# Patient Record
Sex: Female | Born: 1970 | Race: White | Hispanic: No | Marital: Married
Health system: Midwestern US, Community
[De-identification: ages and names within clinical notes are randomized; demographics above are authoritative.]

## PROBLEM LIST (undated history)

## (undated) DIAGNOSIS — R519 Headache, unspecified: Secondary | ICD-10-CM

## (undated) DIAGNOSIS — M1990 Unspecified osteoarthritis, unspecified site: Secondary | ICD-10-CM

## (undated) DIAGNOSIS — T7840XA Allergy, unspecified, initial encounter: Secondary | ICD-10-CM

## (undated) DIAGNOSIS — R51 Headache: Secondary | ICD-10-CM

## (undated) DIAGNOSIS — M797 Fibromyalgia: Secondary | ICD-10-CM

## (undated) DIAGNOSIS — G43909 Migraine, unspecified, not intractable, without status migrainosus: Secondary | ICD-10-CM

## (undated) HISTORY — DX: Headache: R51

## (undated) HISTORY — DX: Allergy, unspecified, initial encounter: T78.40XA

## (undated) HISTORY — DX: Unspecified osteoarthritis, unspecified site: M19.90

## (undated) HISTORY — DX: Headache, unspecified: R51.9

## (undated) HISTORY — PX: BREAST SURGERY: SHX581

## (undated) HISTORY — DX: Fibromyalgia: M79.7

## (undated) HISTORY — PX: KNEE SURGERY: SHX244

## (undated) HISTORY — DX: Migraine, unspecified, not intractable, without status migrainosus: G43.909

## (undated) HISTORY — PX: CYSTECTOMY: SUR359

---

## 2004-08-14 ENCOUNTER — Emergency Department (HOSPITAL_COMMUNITY): Admission: EM | Admit: 2004-08-14 | Discharge: 2004-08-14 | Payer: Self-pay | Admitting: Family Medicine

## 2005-06-08 ENCOUNTER — Emergency Department (HOSPITAL_COMMUNITY): Admission: EM | Admit: 2005-06-08 | Discharge: 2005-06-08 | Payer: Self-pay | Admitting: Emergency Medicine

## 2006-03-15 ENCOUNTER — Other Ambulatory Visit: Payer: Self-pay

## 2006-03-15 ENCOUNTER — Emergency Department: Payer: Self-pay | Admitting: Emergency Medicine

## 2006-07-14 ENCOUNTER — Emergency Department: Payer: Self-pay | Admitting: Emergency Medicine

## 2008-11-07 ENCOUNTER — Emergency Department: Payer: Self-pay | Admitting: Emergency Medicine

## 2009-04-10 ENCOUNTER — Emergency Department: Payer: Self-pay | Admitting: Emergency Medicine

## 2013-10-13 ENCOUNTER — Ambulatory Visit: Payer: Self-pay | Admitting: Family Medicine

## 2014-05-16 ENCOUNTER — Ambulatory Visit: Payer: Self-pay | Admitting: Anesthesiology

## 2014-06-13 ENCOUNTER — Ambulatory Visit: Payer: Self-pay | Admitting: Anesthesiology

## 2014-08-21 ENCOUNTER — Ambulatory Visit: Payer: Self-pay | Admitting: Anesthesiology

## 2015-03-03 NOTE — H&P (Signed)
PATIENT NAMMarland Kitchen:  Myer PeerMAHON, Marie L MR#:  [id number] DATE OF BIRTH:  11-05-1971  DATE OF ADMISSION:  05/16/2014  CHIEF COMPLAINT:  Diffuse body pain.   PROCEDURE:  None.   HISTORY OF PRESENT ILLNESS:  Marie Harris is a pleasant 44 year old white female with long-standing history of diffuse body pain lasting 15 years.  She has been told that she has fibromyalgia and has been referred by Dr. Greggory StallionGeorge for evaluation and treatment.  She states that she has been on Ultram for this pain in the past and tried this on a twice a day dosing regimen and this gives her significant improvement with her pain control.  In past followup with Dr. Greggory StallionGeorge she is not comfortable prescribing this medication and has referred the patient to use for evaluation and long-term follow-up management.  At this point Marie Harris states that she has diffuse body pain.  This can be up to a maximum VAS score of 10, average is about a 9 and its best pain is at a 6.  It does not appear to be influenced by time of day.  There are no known inciting events and she is not sure what seems to aggravate or alleviate her pain, but it is associated with fatigue.  The pain wakes her up and does not let her sleep at night.  It is described as constant, exhausting, tiring with associated tingling.  She does not experienced any significant weakness to the upper or lower extremities.  Bowel and bladder function has been stable.   PAST MEDICAL HISTORY:  Significant for fibromyalgia, history of breast surgery, bilateral knee arthroscopy, previous laparoscopy.   CURRENT MEDICATIONS:  Include Aleve.  She takes this up to 5 times a day.   ALLERGIES:  She has no known allergies.   SOCIAL HISTORY:  She does admit to drinking 4 to 5 beers a day.  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  She is married.  She has one child.  She does smoke.  She denies any other drug abuse.   FAMILY HISTORY:  Significant for a father with alcoholism and chronic pain.   Brother with diabetes and sister with sudden unexplained death.  PRIMARY CARE PHYSICIAN:  Dr. Greggory StallionGeorge.   REVIEW OF SYSTEMS:  Significant for smoking, scoliosis, anemia, easy bruising, otherwise negative review of systems.   PHYSICAL EXAMINATION:  GENERAL:  Reveals a pleasant 44 year old white female alert, oriented, cooperative and compliant.  HEART:  Regular rate and rhythm.   EXTREMITIES:  Lower extremity strength and function are at baseline.  LUNGS:  Clear to auscultation.  HEENT:  Reveals pupils to be equally round and reactive to light.  Extraocular muscles intact.  She is able to ambulate without difficulty.  Good gait and balance.   ASSESSMENT: 1.  Fibromyalgia.  2.  Anxiety.  3.  History of excessive alcohol use.   PLAN: 1.  I had a long discussion with Marie Harris and we have gone over the risks and benefits of using Ultram as an analgesic for fibromyalgia.  I think this is quite reasonable especially if it helps her reduce her alcohol consumption or use of excessive nonsteroidal anti-inflammatory drugs and she states that this is what she expects will happen.  2.  I have counseled her regarding excessive alcohol use and told her that she needs to limit her beer consumption to 1 or 2 beers per day, no more than five days per week and to reduce her Aleve consumption  to no more than 1 to 2 tablets per day.  3.  She needs to continue with physical therapy exercises for stretching and strengthening with follow-up in approximately one month and we will give her a prescription today for Ultram 50 mg tablets one by mouth twice daily, #60, with return to clinic in one month.     ____________________________ Currie Paris. Pernell Dupre, MD jga:ea D: 05/17/2014 17:18:01 ET T: 05/17/2014 17:36:42 ET JOB#: [id number]  cc: Currie Paris. Pernell Dupre, MD, <Dictator> Angus Palms, MD Currie Paris Luby Seamans MD ELECTRONICALLY SIGNED 05/26/2014 3:03

## 2015-07-25 ENCOUNTER — Ambulatory Visit: Payer: Self-pay | Admitting: Family Medicine

## 2015-07-25 ENCOUNTER — Encounter: Payer: Self-pay | Admitting: Family Medicine

## 2015-07-25 ENCOUNTER — Ambulatory Visit (INDEPENDENT_AMBULATORY_CARE_PROVIDER_SITE_OTHER): Payer: BLUE CROSS/BLUE SHIELD | Admitting: Family Medicine

## 2015-07-25 VITALS — BP 120/70 | HR 81 | Temp 98.8°F | Resp 16 | Ht 64.0 in | Wt 130.4 lb

## 2015-07-25 DIAGNOSIS — M797 Fibromyalgia: Secondary | ICD-10-CM | POA: Diagnosis not present

## 2015-07-25 DIAGNOSIS — F17200 Nicotine dependence, unspecified, uncomplicated: Secondary | ICD-10-CM | POA: Diagnosis not present

## 2015-07-25 DIAGNOSIS — Z8742 Personal history of other diseases of the female genital tract: Secondary | ICD-10-CM | POA: Diagnosis not present

## 2015-07-25 DIAGNOSIS — N63 Unspecified lump in unspecified breast: Secondary | ICD-10-CM

## 2015-07-25 MED ORDER — DULOXETINE HCL 30 MG PO CPEP: 30.0000 mg | ORAL_CAPSULE | Freq: Every day | ORAL | Status: DC

## 2015-07-25 NOTE — Patient Instructions (Signed)
Plan CP later this fall with Pap and Mammogram.

## 2015-07-25 NOTE — Progress Notes (Signed)
Name: Marie Harris   MRN: [id number]    DOB: 1971/07/03   Date:07/25/2015       Progress Note  Subjective  Chief Complaint  Chief Complaint  Patient presents with  . Establish Care    HPI  Here to establish care.  Has hx. Of fibromyalgia.  Trouble sleeping (stayhing asleep).  Smoker.  !/2 ppd.  No desire to stop at present.    Has gone to Pain Management in past and has taken Tramadol.  Takes it prn only.  BP at home usually 120/80 No problem-specific assessment & plan notes found for this encounter.   Past Medical History  Diagnosis Date  . Allergy   . Arthritis   . Headache   . Migraine   . Fibromyalgia     Past Surgical History  Procedure Laterality Date  . Breast surgery      cyst removal  . Knee surgery      orthoscopic  . Cystectomy      belly and pelvic laproscopic    Family History  Problem Relation Age of Onset  . Hernia Mother   . Stroke Father   . Hyperlipidemia Brother   . Hypertension Brother   . Diabetes Brother   . Brain cancer Son   . Cancer Maternal Aunt     colon cancer  . Diabetes Maternal Uncle   . Heart disease Maternal Grandfather     Social History   Social History  . Marital Status: Married    Spouse Name: N/A  . Number of Children: N/A  . Years of Education: N/A   Occupational History  . Not on file.   Social History Main Topics  . Smoking status: Current Every Day Smoker -- 1.00 packs/day    Types: Cigarettes  . Smokeless tobacco: Not on file  . Alcohol Use: Yes  . Drug Use: No  . Sexual Activity: Not on file   Other Topics Concern  . Not on file   Social History Narrative  . No narrative on file     Current outpatient prescriptions:  .  traMADol (ULTRAM) 50 MG tablet, Take by mouth., Disp: , Rfl:   No Known Allergies   Review of Systems  Constitutional: Positive for malaise/fatigue. Negative for fever, chills and weight loss.  HENT: Negative for hearing loss.   Eyes: Negative for blurred vision and double  vision.  Respiratory: Negative for cough, sputum production, shortness of breath and wheezing.   Cardiovascular: Negative for chest pain, palpitations, orthopnea and leg swelling.  Gastrointestinal: Negative for heartburn, nausea, vomiting, abdominal pain, diarrhea and blood in stool.  Genitourinary: Negative for dysuria, urgency and frequency.  Musculoskeletal: Positive for myalgias.  Skin: Negative for rash.  Neurological: Positive for headaches. Negative for dizziness, tremors, sensory change, focal weakness and weakness.  Psychiatric/Behavioral: Negative for depression. The patient has insomnia (awakening a night.). The patient is not nervous/anxious.       Objective  Filed Vitals:   07/25/15 1357  BP: 146/84  Pulse: 81  Temp: 98.8 F (37.1 C)  TempSrc: Oral  Resp: 16  Height: 5\' 4"  (1.626 m)  Weight: 130 lb 6.4 oz (59.149 kg)    Physical Exam  Constitutional: She is oriented to person, place, and time and well-developed, well-nourished, and in no distress. No distress.  HENT:  Head: Normocephalic and atraumatic.  Eyes: Conjunctivae and EOM are normal. Pupils are equal, round, and reactive to light. No scleral icterus.  Neck: Normal range of motion.  Neck supple. Carotid bruit is not present. No thyromegaly present.  Cardiovascular: Normal rate, regular rhythm, normal heart sounds and intact distal pulses.  Exam reveals no gallop and no friction rub.   No murmur heard. Pulmonary/Chest: Effort normal and breath sounds normal. No respiratory distress. She has no wheezes. She has no rales.  Abdominal: Soft. Bowel sounds are normal. She exhibits no distension and no mass. There is no tenderness.  Musculoskeletal: She exhibits tenderness (diffuse trigger ;point tenderness.). She exhibits no edema.  Lymphadenopathy:    She has no cervical adenopathy.  Neurological: She is alert and oriented to person, place, and time. No cranial nerve deficit.  Psychiatric: Memory, affect and  judgment normal.       No results found for this or any previous visit (from the past 2160 hour(s)).   Assessment & Plan  Problem List Items Addressed This Visit      Musculoskeletal and Integument   Fibromyalgia - Primary     Other   Compulsive tobacco user syndrome      Meds ordered this encounter  Medications  . traMADol (ULTRAM) 50 MG tablet    Sig: Take by mouth.    1. Fibromyalgia  - DULoxetine (CYMBALTA) 30 MG capsule; Take 1 capsule (30 mg total) by mouth daily.  Dispense: 30 capsule; Refill: 3  2. Compulsive tobacco user syndrome   3. Hx of abnormal cervical Pap smear   4. Breast lump

## 2015-08-08 ENCOUNTER — Telehealth: Payer: Self-pay | Admitting: *Deleted

## 2015-08-08 NOTE — Telephone Encounter (Signed)
Called patient to find out is she was evaluated by ER or Urgent care. Patient replied no and that it is getting better.

## 2015-08-08 NOTE — Telephone Encounter (Signed)
Call patient and be sure she was seen for this in ER or at Urgent care and/or that it has resolved.-jh

## 2015-08-08 NOTE — Telephone Encounter (Signed)
Phone advice record: Caller states she believes she was bitten by a spider behind her knee. She tried draining it and it came out clear, very thick liquid. Later she put peroxide on it and bandaged it. Still very swollen and purple. No fever. New med started, feeling nauseated.   Patient was advised to go to the ER.   Allicon Cox RN 08/01/2015.

## 2015-08-28 ENCOUNTER — Ambulatory Visit (INDEPENDENT_AMBULATORY_CARE_PROVIDER_SITE_OTHER): Payer: BLUE CROSS/BLUE SHIELD | Admitting: Family Medicine

## 2015-08-28 ENCOUNTER — Encounter: Payer: Self-pay | Admitting: Family Medicine

## 2015-08-28 ENCOUNTER — Encounter (INDEPENDENT_AMBULATORY_CARE_PROVIDER_SITE_OTHER): Payer: Self-pay

## 2015-08-28 VITALS — BP 135/84 | HR 86 | Temp 98.4°F | Resp 16 | Ht 64.0 in | Wt 130.0 lb

## 2015-08-28 DIAGNOSIS — M797 Fibromyalgia: Secondary | ICD-10-CM | POA: Diagnosis not present

## 2015-08-28 MED ORDER — DULOXETINE HCL 30 MG PO CPEP: ORAL_CAPSULE | ORAL | Status: DC

## 2015-08-28 NOTE — Progress Notes (Signed)
Name: Marie Harris   MRN: [id number]    DOB: 02/24/1971   Date:08/28/2015       Progress Note  Subjective  Chief Complaint  Chief Complaint  Patient presents with  . Fibromyalgia    follow up    HPI Here for f/u of fibromyalgia.  On Cymbalta 30 mg. For sl. More than 30 days.  No change in sx.  No improvement in mood, sleep, pain.  No problem-specific assessment & plan notes found for this encounter.   Past Medical History  Diagnosis Date  . Allergy   . Arthritis   . Headache   . Migraine   . Fibromyalgia     Social History  Substance Use Topics  . Smoking status: Current Every Day Smoker -- 1.00 packs/day    Types: Cigarettes  . Smokeless tobacco: Not on file  . Alcohol Use: Yes     Current outpatient prescriptions:  .  DULoxetine (CYMBALTA) 30 MG capsule, Take 1 capsule (30 mg total) by mouth daily., Disp: 30 capsule, Rfl: 3  No Known Allergies  Review of Systems  Constitutional: Positive for malaise/fatigue. Negative for fever, chills and weight loss.  HENT: Negative for hearing loss.   Eyes: Negative for blurred vision and double vision.  Respiratory: Negative for cough, sputum production, shortness of breath and wheezing.   Cardiovascular: Negative for chest pain, palpitations and leg swelling.  Gastrointestinal: Negative for heartburn, abdominal pain and blood in stool.  Genitourinary: Negative for dysuria, urgency and frequency.  Musculoskeletal: Positive for myalgias.  Neurological: Positive for headaches. Negative for dizziness, tremors and weakness.      Objective  Filed Vitals:   08/28/15 0805  BP: 135/84  Pulse: 86  Temp: 98.4 F (36.9 C)  TempSrc: Oral  Resp: 16  Height: 5\' 4"  (1.626 m)  Weight: 130 lb (58.968 kg)     Physical Exam  Constitutional: She is oriented to person, place, and time and well-developed, well-nourished, and in no distress. No distress.  HENT:  Head: Normocephalic and atraumatic.  Eyes: Conjunctivae and EOM  are normal. Pupils are equal, round, and reactive to light.  Neck: Normal range of motion. Neck supple. No thyromegaly present.  Cardiovascular: Normal rate, regular rhythm and normal heart sounds.  Exam reveals no gallop and no friction rub.   No murmur heard. Pulmonary/Chest: Effort normal and breath sounds normal. No respiratory distress. She has no wheezes. She has no rales.  Abdominal: Bowel sounds are normal. She exhibits no distension. There is no tenderness. There is no rebound.  Musculoskeletal: She exhibits no edema.  Lymphadenopathy:    She has no cervical adenopathy.  Neurological: She is alert and oriented to person, place, and time.  Multiple trigger points in back, chest, elbows, knees, ankles.  Psychiatric:  Affect is sl. Depressed.  Vitals reviewed.     No results found for this or any previous visit (from the past 2160 hour(s)).   Assessment & Plan   1. Fibromyalgia  - DULoxetine (CYMBALTA) 30 MG capsule; Take two capsules each morning  Dispense: 60 capsule; Refill: 3  (increased from 30 mg / day).

## 2015-08-28 NOTE — Patient Instructions (Signed)
Patient declines flu shot today.

## 2015-08-30 ENCOUNTER — Telehealth: Payer: Self-pay | Admitting: *Deleted

## 2015-08-30 NOTE — Telephone Encounter (Signed)
Called patient to f/u on after-hours call. She called and stated she was very confused and disoriented. Today patient states she is fine. I told her to call if she needed to follow-up.

## 2015-08-31 NOTE — Telephone Encounter (Signed)
Agree-jh 

## 2015-09-11 ENCOUNTER — Encounter: Payer: BLUE CROSS/BLUE SHIELD | Admitting: Family Medicine

## 2015-09-13 ENCOUNTER — Encounter: Payer: Self-pay | Admitting: Emergency Medicine

## 2015-09-13 DIAGNOSIS — Y9289 Other specified places as the place of occurrence of the external cause: Secondary | ICD-10-CM | POA: Diagnosis not present

## 2015-09-13 DIAGNOSIS — S52591A Other fractures of lower end of right radius, initial encounter for closed fracture: Secondary | ICD-10-CM | POA: Insufficient documentation

## 2015-09-13 DIAGNOSIS — Z720 Tobacco use: Secondary | ICD-10-CM | POA: Insufficient documentation

## 2015-09-13 DIAGNOSIS — S6991XA Unspecified injury of right wrist, hand and finger(s), initial encounter: Secondary | ICD-10-CM | POA: Diagnosis present

## 2015-09-13 DIAGNOSIS — Y998 Other external cause status: Secondary | ICD-10-CM | POA: Insufficient documentation

## 2015-09-13 DIAGNOSIS — Z3202 Encounter for pregnancy test, result negative: Secondary | ICD-10-CM | POA: Diagnosis not present

## 2015-09-13 DIAGNOSIS — Y9389 Activity, other specified: Secondary | ICD-10-CM | POA: Diagnosis not present

## 2015-09-13 DIAGNOSIS — W01198A Fall on same level from slipping, tripping and stumbling with subsequent striking against other object, initial encounter: Secondary | ICD-10-CM | POA: Insufficient documentation

## 2015-09-13 DIAGNOSIS — S20419A Abrasion of unspecified back wall of thorax, initial encounter: Secondary | ICD-10-CM | POA: Insufficient documentation

## 2015-09-13 MED ORDER — IBUPROFEN 800 MG PO TABS
800.0000 mg | ORAL_TABLET | Freq: Once | ORAL | Status: AC
  Administered 2015-09-13: 800 mg via ORAL

## 2015-09-13 MED ORDER — IBUPROFEN 800 MG PO TABS
ORAL_TABLET | ORAL | Status: AC
  Filled 2015-09-13: qty 1

## 2015-09-13 NOTE — ED Notes (Signed)
Ice to right wrist

## 2015-09-13 NOTE — ED Notes (Signed)
Fall from standing on top of table.  Hit head, injured right wrist.  C/o pain right wirst, back hurts, head hurts.  - LOC.

## 2015-09-14 ENCOUNTER — Emergency Department: Payer: BLUE CROSS/BLUE SHIELD

## 2015-09-14 ENCOUNTER — Emergency Department
Admission: EM | Admit: 2015-09-14 | Discharge: 2015-09-14 | Disposition: A | Discharge status: Home / Self Care | Payer: BLUE CROSS/BLUE SHIELD | Attending: Emergency Medicine | Admitting: Emergency Medicine

## 2015-09-14 DIAGNOSIS — S62102A Fracture of unspecified carpal bone, left wrist, initial encounter for closed fracture: Secondary | ICD-10-CM

## 2015-09-14 LAB — POCT PREGNANCY, URINE: Preg Test, Ur: NEGATIVE

## 2015-09-14 MED ORDER — ONDANSETRON 4 MG PO TBDP
ORAL_TABLET | ORAL | Status: AC
  Administered 2015-09-14: 4 mg
  Filled 2015-09-14: qty 1

## 2015-09-14 MED ORDER — OXYCODONE-ACETAMINOPHEN 5-325 MG PO TABS
1.0000 | ORAL_TABLET | Freq: Once | ORAL | Status: AC
  Administered 2015-09-14: 1 via ORAL

## 2015-09-14 MED ORDER — OXYCODONE-ACETAMINOPHEN 5-325 MG PO TABS
ORAL_TABLET | ORAL | Status: AC
  Administered 2015-09-14: 1 via ORAL
  Filled 2015-09-14: qty 2

## 2015-09-14 MED ORDER — OXYCODONE-ACETAMINOPHEN 5-325 MG PO TABS: 1.0000 | ORAL_TABLET | ORAL | Status: DC | PRN

## 2015-09-14 NOTE — ED Provider Notes (Signed)
[facility] Emergency Department Provider Note  ____________________________________________  Time seen: 2:00 AM  I have reviewed the triage vital signs and the nursing notes.   HISTORY  Chief Complaint No chief complaint on file.     HPI Marie Harris is a 44 y.o. female presents with history of accidental fall all standing on top of the table today. Patient admits to hitting a portion of her head as well as an injury to her right wrist with resultant pain and swelling as well as mid back pain.    Past Medical History  Diagnosis Date  . Allergy   . Arthritis   . Headache   . Migraine   . Fibromyalgia     Patient Active Problem List   Diagnosis Date Noted  . Breast lump 07/25/2015  . Compulsive tobacco user syndrome 07/25/2015  . Fibromyalgia 07/25/2015  . Hx of abnormal cervical Pap smear 07/25/2015    Past Surgical History  Procedure Laterality Date  . Breast surgery      cyst removal  . Knee surgery      orthoscopic  . Cystectomy      belly and pelvic laproscopic    Current Outpatient Rx  Name  Route  Sig  Dispense  Refill  . DULoxetine (CYMBALTA) 30 MG capsule      Take two capsules each morning   60 capsule   3   . oxyCODONE-acetaminophen (PERCOCET/ROXICET) 5-325 MG tablet   Oral   Take 1 tablet by mouth every 4 (four) hours as needed for severe pain.   20 tablet   0     Allergies No known drug allergies  Family History  Problem Relation Age of Onset  . Hernia Mother   . Stroke Father   . Hyperlipidemia Brother   . Hypertension Brother   . Diabetes Brother   . Brain cancer Son   . Cancer Maternal Aunt     colon cancer  . Diabetes Maternal Uncle   . Heart disease Maternal Grandfather     Social History Social History  Substance Use Topics  . Smoking status: Current Every Day Smoker -- 1.00 packs/day    Types: Cigarettes  . Smokeless tobacco: None  . Alcohol Use: Yes    Review of  Systems  Constitutional: Negative for fever. Eyes: Negative for visual changes. ENT: Negative for sore throat. Cardiovascular: Negative for chest pain. Respiratory: Negative for shortness of breath. Gastrointestinal: Negative for abdominal pain, vomiting and diarrhea. Genitourinary: Negative for dysuria. Musculoskeletal: positive for mid back/right wrist pain Skin: Negative for rash. Neurological: Negative for headaches, focal weakness or numbness.   10-point ROS otherwise negative.  ____________________________________________   PHYSICAL EXAM:  VITAL SIGNS: ED Triage Vitals  Enc Vitals Group     BP 09/13/15 2215 111/71 mmHg     Pulse Rate 09/13/15 2215 69     Resp 09/13/15 2215 18     Temp 09/13/15 2215 97.7 F (36.5 C)     Temp Source 09/13/15 2215 Oral     SpO2 09/13/15 2215 95 %     Weight 09/13/15 2215 131 lb (59.421 kg)     Height 09/13/15 2215 5\' 3"  (1.6 m)     Head Cir --      Peak Flow --      Pain Score 09/13/15 2216 10     Pain Loc --      Pain Edu? --      Excl. in GC? --  Constitutional: Alert and oriented. Well appearing and in no distress. Eyes: Conjunctivae are normal. PERRL. Normal extraocular movements. ENT   Head: Normocephalic and atraumatic.   Nose: No congestion/rhinnorhea.   Mouth/Throat: Mucous membranes are moist.   Neck: No stridor. Hematological/Lymphatic/Immunilogical: No cervical lymphadenopathy. Cardiovascular: Normal rate, regular rhythm. Normal and symmetric distal pulses are present in all extremities. No murmurs, rubs, or gallops. Respiratory: Normal respiratory effort without tachypnea nor retractions. Breath sounds are clear and equal bilaterally. No wheezes/rales/rhonchi. Gastrointestinal: Soft and nontender. No distention. There is no CVA tenderness. Genitourinary: deferred Musculoskeletal: pain with palpation of the radial aspect of the right wrist , swelling noted in this area as well. Neurologic:  Normal  speech and language. No gross focal neurologic deficits are appreciated. Speech is normal.  Skin: abrasion noted over T 10 through 12 area/ right wrist pain and swelling on the radial aspect. Psychiatric: Mood and affect are normal. Speech and behavior are normal. Patient exhibits appropriate insight and judgment.      RADIOLOGY Imaging Results       DG Wrist Complete Right (Final result) Result time: 09/14/15 02:28:39   Final result by Rad Results In Interface (09/14/15 02:28:39)   Narrative:   CLINICAL DATA: Fell from standing  EXAM: RIGHT WRIST - COMPLETE 3+ VIEW  COMPARISON: None.  FINDINGS: There is a comminuted intra-articular distal radius fracture with mild step-off in the articular surface due to impaction at the ulnar aspect of the distal radius. There is widening of the distal radial ulnar joint suggesting possibility of distal radial ulnar joint disruption.  IMPRESSION: Intraarticular mildly impacted fracture of the distal radius. Question integrity of the distal radial ulnar joint.   Electronically Signed By: Ellery Plunk M.D. On: 09/14/2015 02:28          DG Thoracic Spine 2 View (Final result) Result time: 09/14/15 62:70:35   Final result by Rad Results In Interface (09/14/15 00:93:81)   Narrative:   CLINICAL DATA: Fell from standing on top of table. Hit head injury right wrist. Back pain.  EXAM: THORACIC SPINE 2 VIEWS  COMPARISON: None.  FINDINGS: There is no evidence of thoracic spine fracture. Alignment is normal. No other significant bone abnormalities are identified.  IMPRESSION: Negative.   Electronically Signed By: Burman Nieves M.D. On: 09/14/2015 02:28          DG Lumbar Spine Complete (Final result) Result time: 09/14/15 02:29:33   Final result by Rad Results In Interface (09/14/15 02:29:33)   Narrative:   CLINICAL DATA: Fell from standing.  EXAM: LUMBAR SPINE - COMPLETE 4+  VIEW  COMPARISON: None.  FINDINGS: There is no evidence of lumbar spine fracture. Alignment is normal. Intervertebral disc spaces are maintained.  IMPRESSION: Negative.   Electronically Signed By: Ellery Plunk M.D. On: 09/14/2015 02:29          CT Head Wo Contrast (Final result) Result time: 09/14/15 02:06:58   Final result by Rad Results In Interface (09/14/15 02:06:58)   Narrative:   CLINICAL DATA: Larey Seat after standing on table top. Head injury without loss of consciousness. History of migraine headaches.  EXAM: CT HEAD WITHOUT CONTRAST  TECHNIQUE: Contiguous axial images were obtained from the base of the skull through the vertex without intravenous contrast.  COMPARISON: CT head October 13, 2013  FINDINGS: The ventricles and sulci are normal. No intraparenchymal hemorrhage, mass effect nor midline shift. No acute large vascular territory infarcts.  No abnormal extra-axial fluid collections. Basal cisterns are patent.  No skull  fracture. The included ocular globes and orbital contents are non-suspicious. Partially imaged LEFT maxillary sinus soft tissue opacification, LEFT sphenoid ethmoidal mucosal thickening. The mastoid air cells are well aerated. RIGHT occipital exostosis.  IMPRESSION: No acute intracranial process.  Partially imaged LEFT paranasal sinusitis.   Electronically Signed By: Awilda Metro M.D. On: 09/14/2015 02:06             INITIAL IMPRESSION / ASSESSMENT AND PLAN / ED COURSE  Pertinent labs & imaging results that were available during my care of the patient were reviewed by me and considered in my medical decision making (see chart for details).  Splint was applied to the patient right wrist. Patient received Percocet for pain and will be prescribed the same for home. Patient is being referred to Dr. Ernest Pine orthopedic surgeon on-call.  ____________________________________________   FINAL CLINICAL  IMPRESSION(S) / ED DIAGNOSES  Final diagnoses:  Left wrist fracture, closed, initial encounter      Darci Current, MD 09/14/15 0410

## 2015-09-14 NOTE — Discharge Instructions (Signed)
Wrist Fracture °A wrist fracture is a break or crack in one of the bones of your wrist. Your wrist is made up of eight small bones at the palm of your hand (carpal bones) and two long bones that make up your forearm (radius and ulna). °CAUSES °· A direct blow to the wrist. °· Falling on an outstretched hand. °· Trauma, such as a car accident or a fall. °RISK FACTORS °Risk factors for wrist fracture include: °· Participating in contact and high-risk sports, such as skiing, biking, and ice skating. °· Taking steroid medicines. °· Smoking. °· Being female. °· Being Caucasian. °· Drinking more than three alcoholic beverages per day. °· Having low or lowered bone density (osteoporosis or osteopenia). °· Age. Older adults have decreased bone density. °· Women who have had menopause. °· History of previous fractures. °SIGNS AND SYMPTOMS °Symptoms of wrist fractures include tenderness, bruising, and inflammation. Additionally, the wrist may hang in an odd position or appear deformed. °DIAGNOSIS °Diagnosis may include: °· Physical exam. °· X-ray. °TREATMENT °Treatment depends on many factors, including the nature and location of the fracture, your age, and your activity level. Treatment for wrist fracture can be nonsurgical or surgical. °Nonsurgical Treatment °A plaster cast or splint may be applied to your wrist if the bone is in a good position. If the fracture is not in good position, it may be necessary for your health care provider to realign it before applying a splint or cast. Usually, a cast or splint will be worn for several weeks. °Surgical Treatment °Sometimes the position of the bone is so far out of place that surgery is required to apply a device to hold it together as it heals. Depending on the fracture, there are a number of options for holding the bone in place while it heals, such as a cast and metal pins. °HOME CARE INSTRUCTIONS °· Keep your injured wrist elevated and move your fingers as much as  possible. °· Do not put pressure on any part of your cast or splint. It may break. °· Use a plastic bag to protect your cast or splint from water while bathing or showering. Do not lower your cast or splint into water. °· Take medicines only as directed by your health care provider. °· Keep your cast or splint clean and dry. If it becomes wet, damaged, or suddenly feels too tight, contact your health care provider right away. °· Do not use any tobacco products including cigarettes, chewing tobacco, or electronic cigarettes. Tobacco can delay bone healing. If you need help quitting, ask your health care provider. °· Keep all follow-up visits as directed by your health care provider. This is important. °· Ask your health care provider if you should take supplements of calcium and vitamins C and D to promote bone healing. °SEEK MEDICAL CARE IF: °· Your cast or splint is damaged, breaks, or gets wet. °· You have a fever. °· You have chills. °· You have continued severe pain or more swelling than you did before the cast was put on. °SEEK IMMEDIATE MEDICAL CARE IF: °· Your hand or fingernails on the injured arm turn blue or gray, or feel cold or numb. °· You have decreased feeling in the fingers of your injured arm. °MAKE SURE YOU: °· Understand these instructions. °· Will watch your condition. °· Will get help right away if you are not doing well or get worse. °  °This information is not intended to replace advice given to you by your   health care provider. Make sure you discuss any questions you have with your health care provider. °  °Document Released: 08/06/2005 Document Revised: 07/18/2015 Document Reviewed: 11/14/2011 °Elsevier Interactive Patient Education ©2016 [employer] ° °

## 2015-10-09 ENCOUNTER — Ambulatory Visit (INDEPENDENT_AMBULATORY_CARE_PROVIDER_SITE_OTHER): Payer: BLUE CROSS/BLUE SHIELD | Admitting: Family Medicine

## 2015-10-09 ENCOUNTER — Ambulatory Visit
Admission: RE | Admit: 2015-10-09 | Discharge: 2015-10-09 | Disposition: A | Discharge status: Home / Self Care | Payer: BLUE CROSS/BLUE SHIELD | Source: Ambulatory Visit | Attending: Family Medicine | Admitting: Family Medicine

## 2015-10-09 ENCOUNTER — Encounter: Payer: Self-pay | Admitting: Family Medicine

## 2015-10-09 VITALS — BP 123/85 | HR 96 | Temp 98.3°F | Resp 16 | Ht 64.0 in | Wt 126.8 lb

## 2015-10-09 DIAGNOSIS — M549 Dorsalgia, unspecified: Secondary | ICD-10-CM

## 2015-10-09 DIAGNOSIS — M797 Fibromyalgia: Secondary | ICD-10-CM | POA: Diagnosis not present

## 2015-10-09 MED ORDER — CYCLOBENZAPRINE HCL 10 MG PO TABS: ORAL_TABLET | ORAL | Status: DC

## 2015-10-09 MED ORDER — MELOXICAM 15 MG PO TABS: 15.0000 mg | ORAL_TABLET | Freq: Every day | ORAL | Status: DC

## 2015-10-09 NOTE — Progress Notes (Signed)
Name: Marie Harris   MRN: [id number]    DOB: 11/10/1971   Date:10/09/2015       Progress Note  Subjective  Chief Complaint  Chief Complaint  Patient presents with  . Fibromyalgia    not improving quit meds migraine came back    HPI Here for f/u of fibromyalgia.  Stopped Cymbalta about 1 month ago because she felt it caused migraine headaches.  No meds for past month.  Broke R  wrist on Nov. 3.  Seeing Dr. Tawanna Cooler Monday at [facility].  Was taking oxycodone, but stopped it last week.  Fibromyalgia doing ok, but having back pain sec. to fall also.  Pain in mid back.  X-rays in ER were neg. Hit back when falling off a table.   No problem-specific assessment & plan notes found for this encounter.   Past Medical History  Diagnosis Date  . Allergy   . Arthritis   . Headache   . Migraine   . Fibromyalgia     Past Surgical History  Procedure Laterality Date  . Breast surgery      cyst removal  . Knee surgery      orthoscopic  . Cystectomy      belly and pelvic laproscopic    Family History  Problem Relation Age of Onset  . Hernia Mother   . Stroke Father   . Hyperlipidemia Brother   . Hypertension Brother   . Diabetes Brother   . Brain cancer Son   . Cancer Maternal Aunt     colon cancer  . Diabetes Maternal Uncle   . Heart disease Maternal Grandfather     Social History   Social History  . Marital Status: Married    Spouse Name: N/A  . Number of Children: N/A  . Years of Education: N/A   Occupational History  . Not on file.   Social History Main Topics  . Smoking status: Current Every Day Smoker -- 1.00 packs/day    Types: Cigarettes  . Smokeless tobacco: Not on file  . Alcohol Use: Yes  . Drug Use: No  . Sexual Activity: Not on file   Other Topics Concern  . Not on file   Social History Narrative     Current outpatient prescriptions:  .  cyclobenzaprine (FLEXERIL) 10 MG tablet, Take 1/2 - 1 tablet up to three times a day for muscle spasm., Disp:  30 tablet, Rfl: 1 .  meloxicam (MOBIC) 15 MG tablet, Take 1 tablet (15 mg total) by mouth daily., Disp: 30 tablet, Rfl: 2  Not on File   Review of Systems  Constitutional: Negative for fever, chills, weight loss and malaise/fatigue.  HENT: Negative for hearing loss.   Eyes: Negative for blurred vision and double vision.  Respiratory: Negative for cough, shortness of breath and wheezing.   Cardiovascular: Negative for chest pain, palpitations and leg swelling.  Gastrointestinal: Negative for heartburn, abdominal pain and blood in stool.  Genitourinary: Negative for dysuria, urgency and frequency.  Musculoskeletal: Positive for back pain.       General fibromyalgia pain is mild at present  Skin: Negative for rash.  Neurological: Negative for dizziness, tremors, weakness and headaches.      Objective  Filed Vitals:   10/09/15 0808  BP: 123/85  Pulse: 96  Temp: 98.3 F (36.8 C)  TempSrc: Oral  Resp: 16  Height:  (1.626 m)  Weight: 126 lb 12.8 oz (57.516 kg)    Physical Exam  Constitutional:  She is well-developed, well-nourished, and in no distress. No distress.  HENT:  Head: Normocephalic and atraumatic.  Cardiovascular: Normal rate, regular rhythm, normal heart sounds and intact distal pulses.  Exam reveals no gallop and no friction rub.   No murmur heard. Pulmonary/Chest: Effort normal. No respiratory distress. She has no wheezes. She has no rales.  Musculoskeletal:  Pain to palp(ation of mid thoracic vert. To palp and with flexion of back.  No bony abn. Felt.  No neuro sx.  Wearing cast on R wrist.       Recent Results (from the past 2160 hour(s))  Pregnancy, urine POC     Status: None   Collection Time: 09/14/15  1:52 AM  Result Value Ref Range   Preg Test, Ur NEGATIVE NEGATIVE    Comment:        THE SENSITIVITY OF THIS METHODOLOGY IS >24 mIU/mL      Assessment & Plan  Problem List Items Addressed This Visit      Musculoskeletal and Integument    Fibromyalgia   Relevant Medications   meloxicam (MOBIC) 15 MG tablet     Other   Back pain, acute - Primary   Relevant Medications   meloxicam (MOBIC) 15 MG tablet   cyclobenzaprine (FLEXERIL) 10 MG tablet   Other Relevant Orders   DG Thoracic Spine W/Swimmers      Meds ordered this encounter  Medications  . meloxicam (MOBIC) 15 MG tablet    Sig: Take 1 tablet (15 mg total) by mouth daily.    Dispense:  30 tablet    Refill:  2  . cyclobenzaprine (FLEXERIL) 10 MG tablet    Sig: Take 1/2 - 1 tablet up to three times a day for muscle spasm.    Dispense:  30 tablet    Refill:  1   1. Back pain, acute  - meloxicam (MOBIC) 15 MG tablet; Take 1 tablet (15 mg total) by mouth daily.  Dispense: 30 tablet; Refill: 2 - cyclobenzaprine (FLEXERIL) 10 MG tablet; Take 1/2 - 1 tablet up to three times a day for muscle spasm.  Dispense: 30 tablet; Refill: 1 - DG Thoracic Spine W/Swimmers; Future  2. Fibromyalgia

## 2015-10-17 ENCOUNTER — Encounter: Payer: BLUE CROSS/BLUE SHIELD | Admitting: Family Medicine

## 2015-11-13 IMAGING — CT CT HEAD W/O CM
1 series · 16 of 30 positions shown, 20 images · non-contrast
Comparison: CT head October 13, 2013

CLINICAL DATA: Fell after standing on table top. Head injury
without loss of consciousness. History of migraine headaches.

EXAM:
CT HEAD WITHOUT CONTRAST
TECHNIQUE: Contiguous axial images were obtained from the base of the skull
through the vertex without intravenous contrast.

[Series 2: head wo · axial · 0.39mm/px · z∈[-136,-7]mm · 16 of 32 slices shown, 20 images]
[im 2/32  brain]
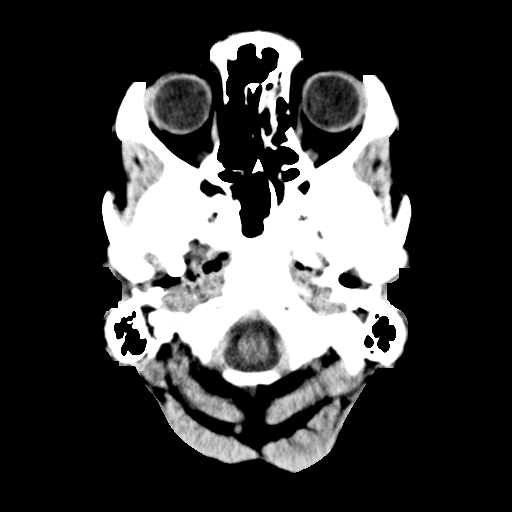
[im 2/32  bone]
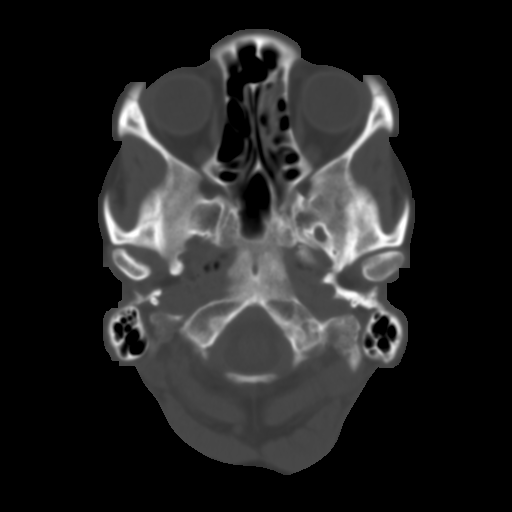
[im 4/32  brain]
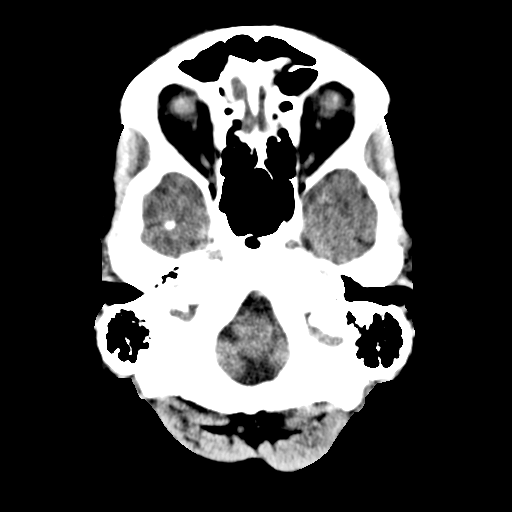
[im 6/32  brain]
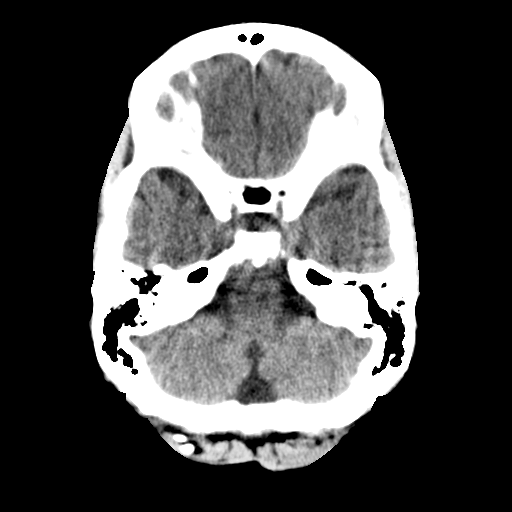
[im 8/32  brain]
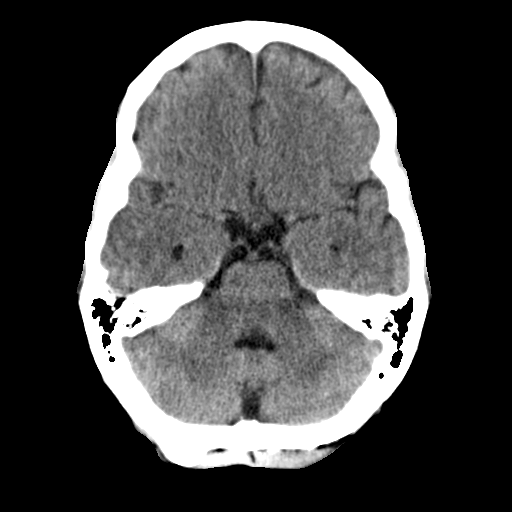
[im 9/32  brain]
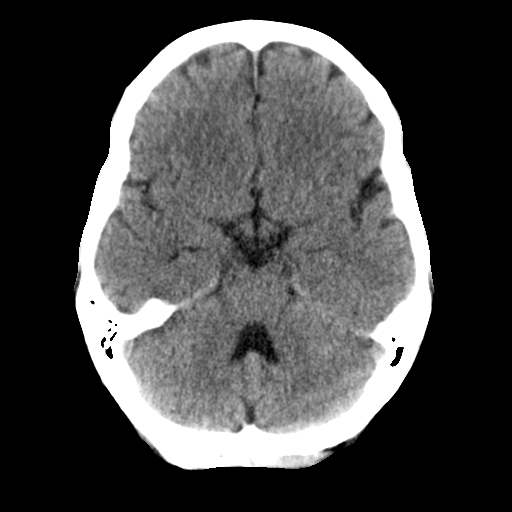
[im 9/32  bone]
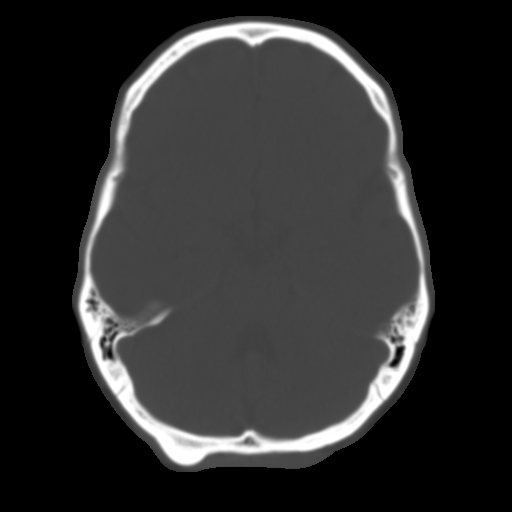
[im 11/32  brain]
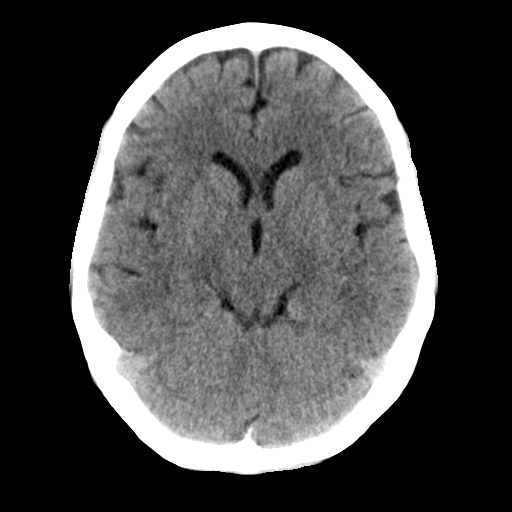
[im 13/32  brain]
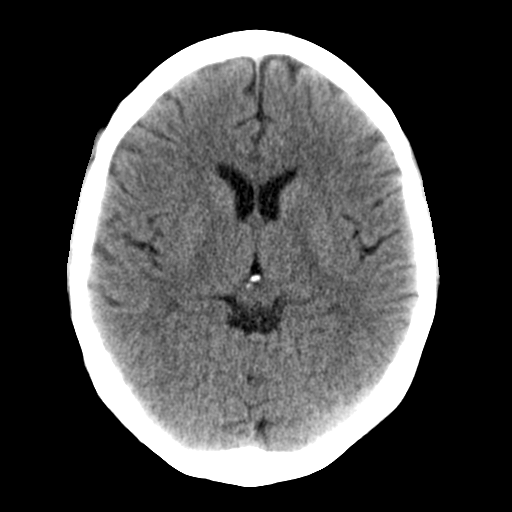
[im 15/32  brain]
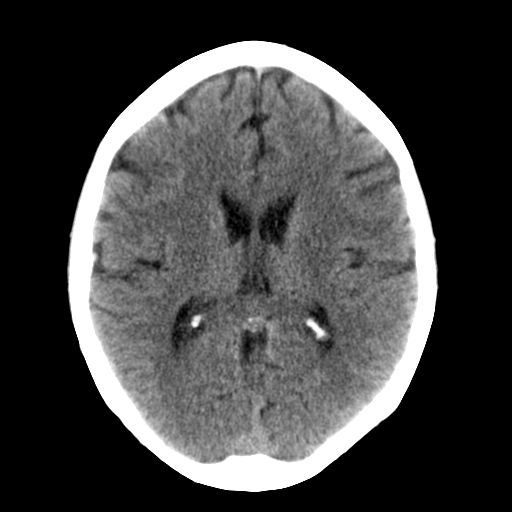
[im 17/32  brain]
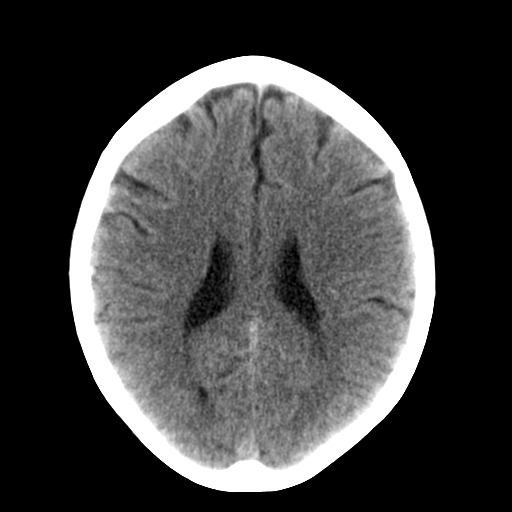
[im 17/32  bone]
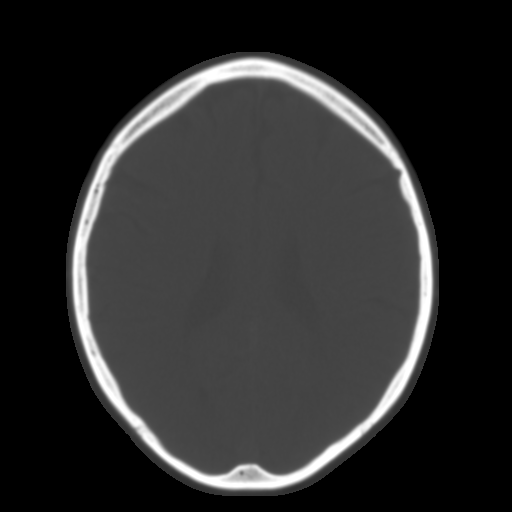
[im 19/32  brain]
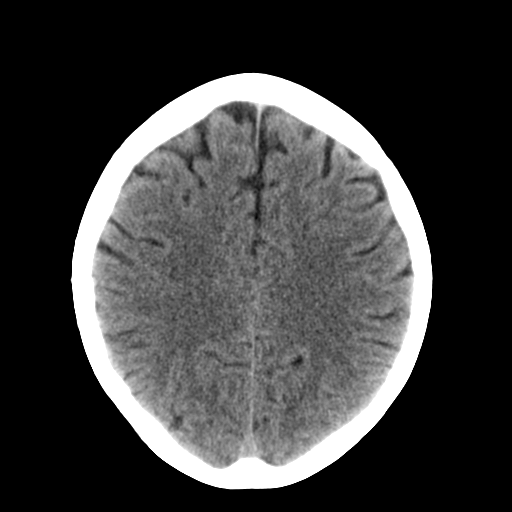
[im 21/32  brain]
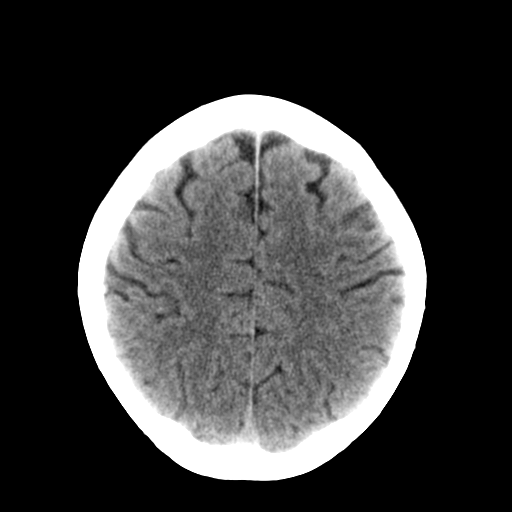
[im 23/32  brain]
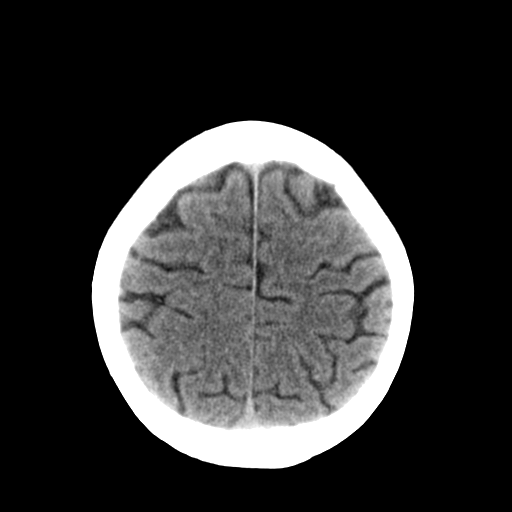
[im 24/32  brain]
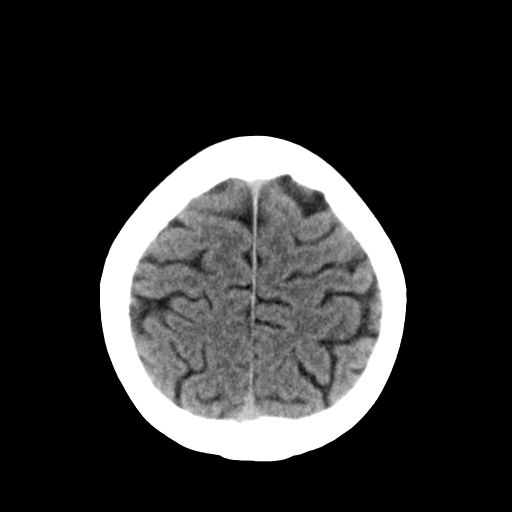
[im 24/32  bone]
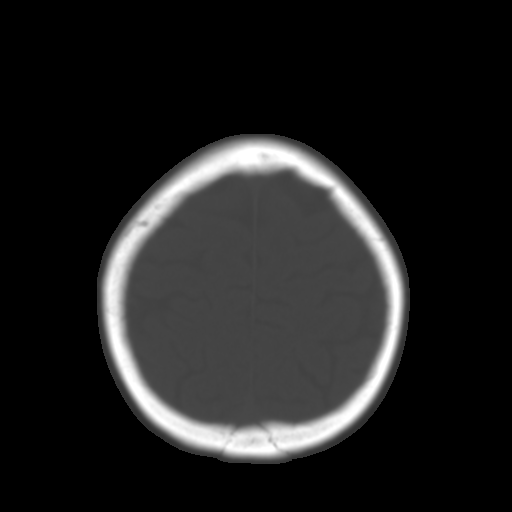
[im 26/32  brain]
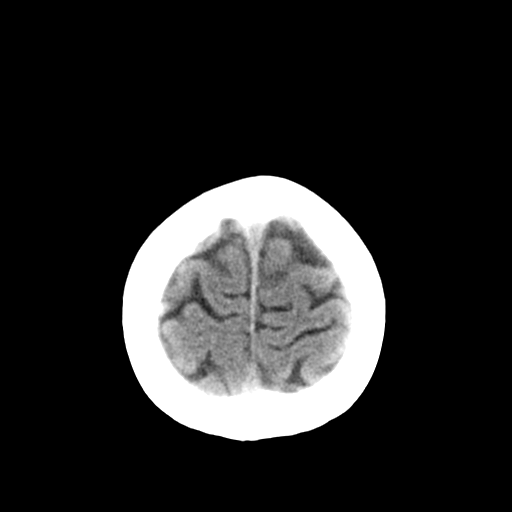
[im 28/32  brain]
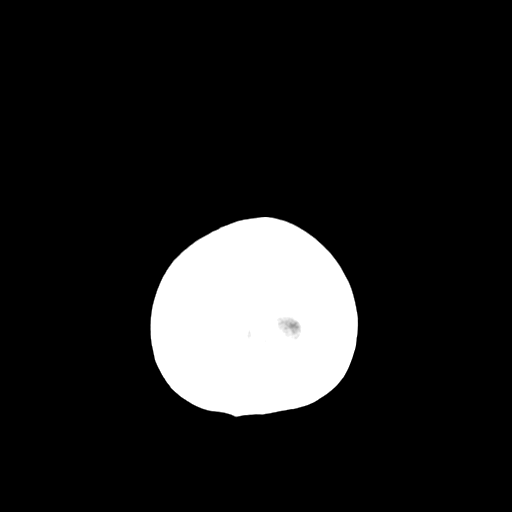
[im 30/32  brain]
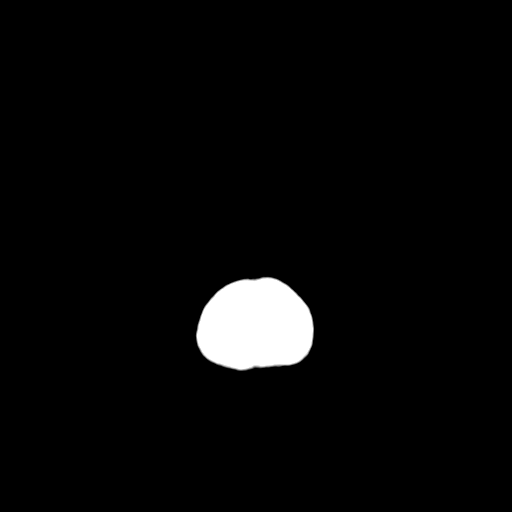

[16 of 30 positions shown; findings below may reference images not displayed]

FINDINGS: The ventricles and sulci are normal. No intraparenchymal hemorrhage,
mass effect nor midline shift. No acute large vascular territory
infarcts.

No abnormal extra-axial fluid collections. Basal cisterns are
patent.

No skull fracture. The included ocular globes and orbital contents
are non-suspicious. Partially imaged LEFT maxillary sinus soft
tissue opacification, LEFT sphenoid ethmoidal mucosal thickening.
The mastoid air cells are well aerated. RIGHT occipital exostosis.
IMPRESSION: No acute intracranial process.

Partially imaged LEFT paranasal sinusitis.

## 2015-12-08 IMAGING — CR DG THORACIC SPINE 3V
1 series · 3 of 3 positions shown · non-contrast
Comparison: Thoracic spine series of September 14, 2015

CLINICAL DATA: Direct trauma to the mid to lower T-spine region on
[DATE][REDACTED], persistent pain

EXAM:
THORACIC SPINE - 3 VIEWS

[Series 1: ap · 0.17mm/px · 3 of 3 slices shown]
[im 1/3]
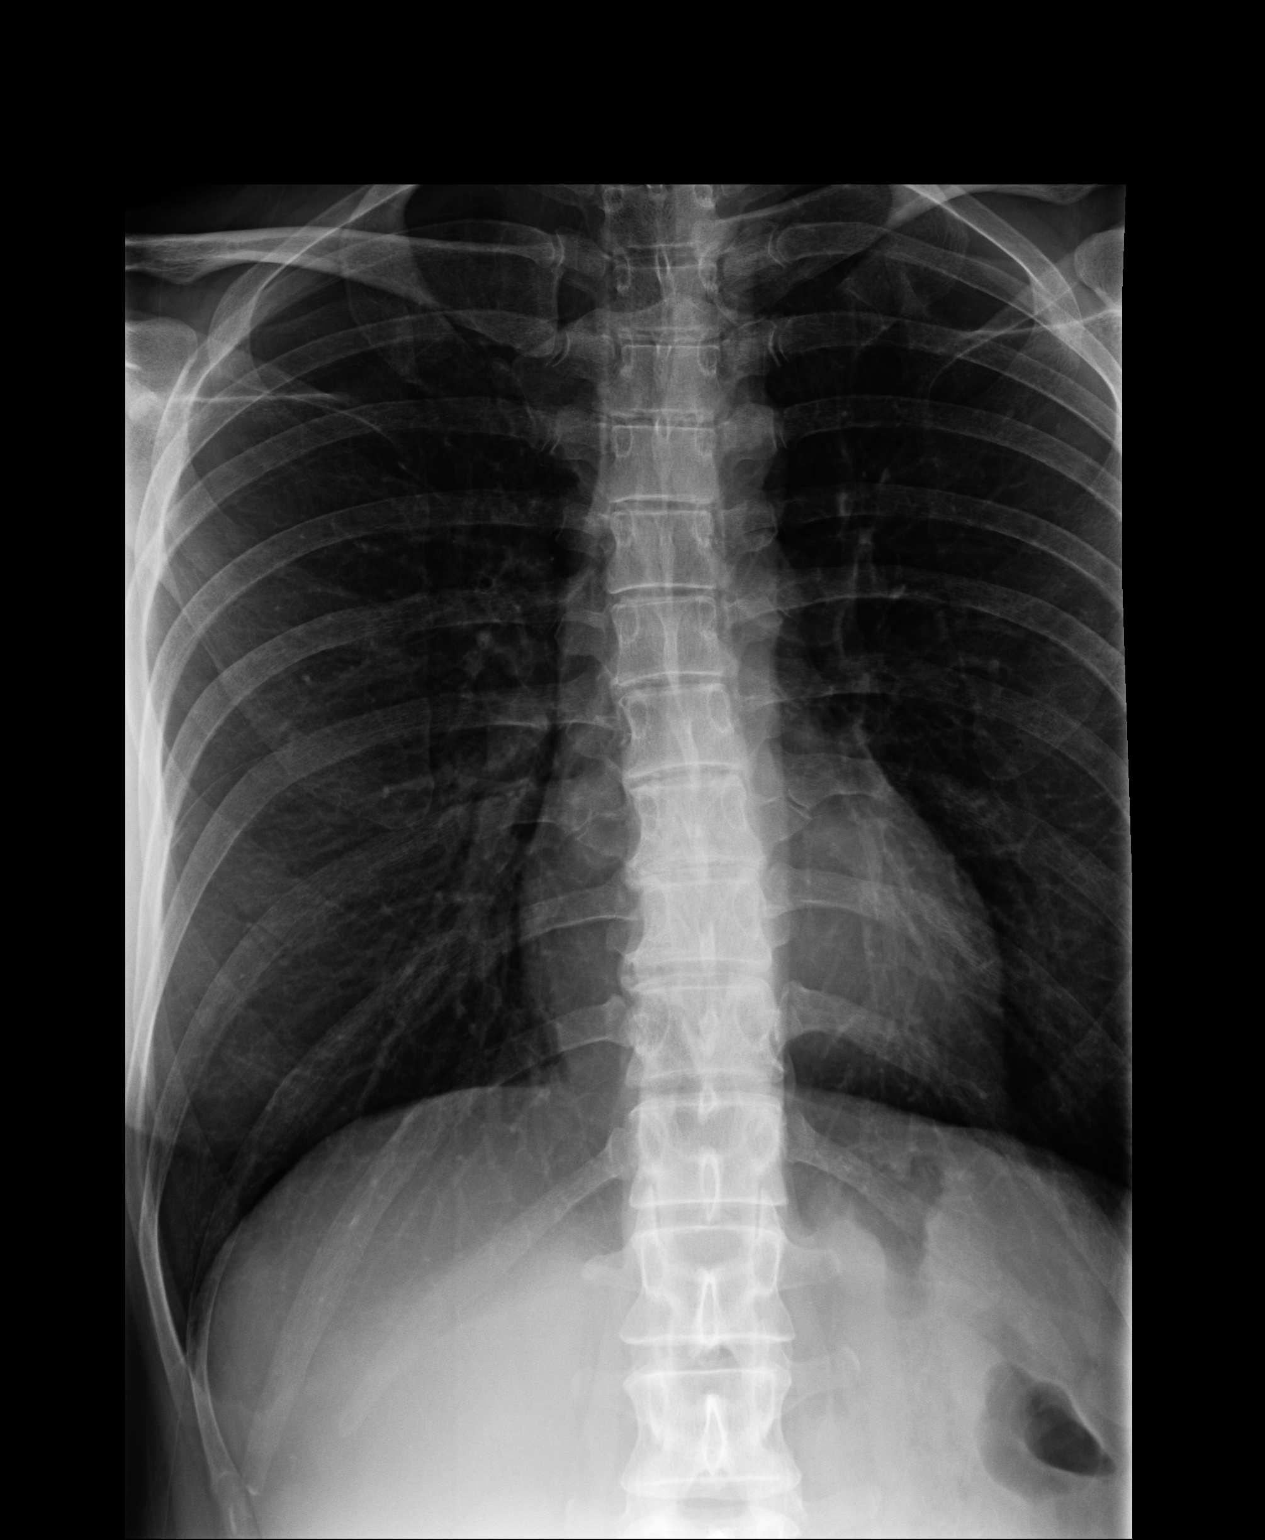
[im 2/3]
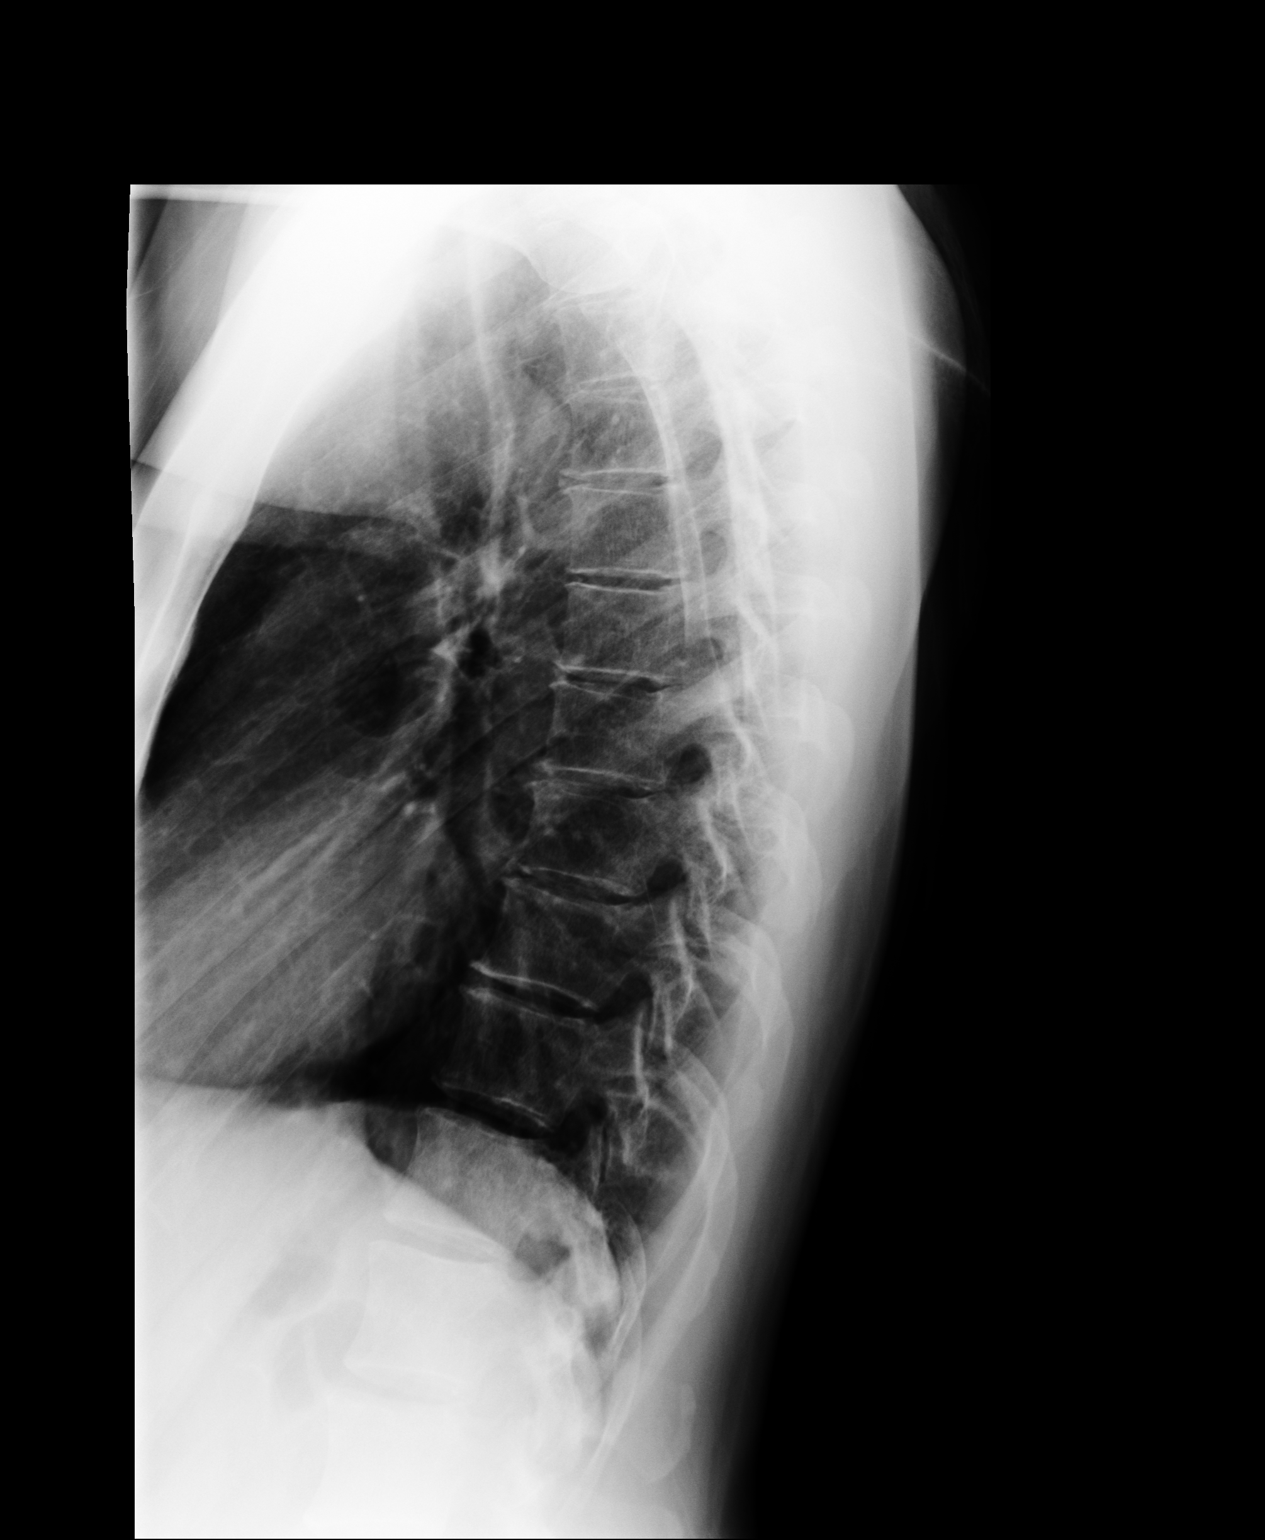
[im 3/3]
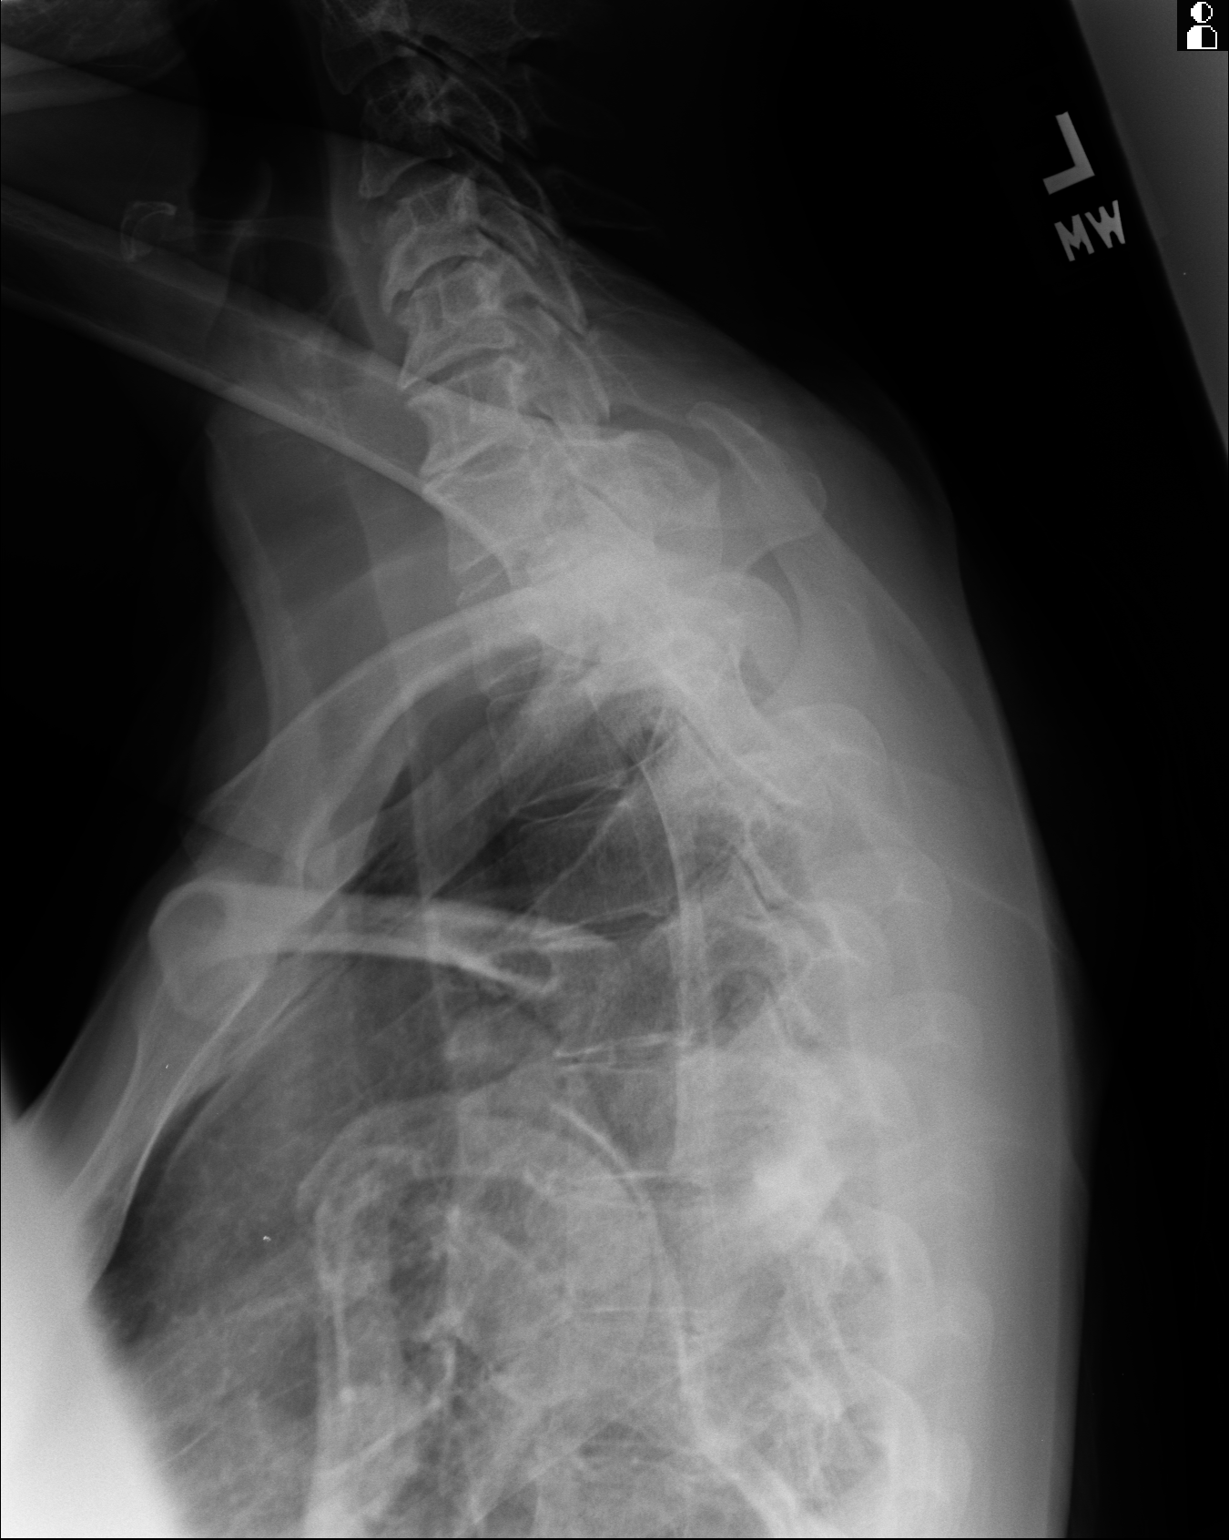

[3 of 3 positions shown; findings below may reference images not displayed]

FINDINGS: The thoracic vertebral bodies are preserved in height. Gentle
dextrocurvature centered at T6 is present and stable. The disc space
heights are well maintained. The pedicles appear intact. There are
no abnormal paravertebral soft tissue densities.
IMPRESSION: There is no acute or significant chronic post traumatic bony
abnormality of the thoracic spine. There is mild stable dextro-
curvature of the mid thoracic spine

## 2015-12-11 ENCOUNTER — Encounter: Payer: Self-pay | Admitting: Family Medicine

## 2015-12-11 ENCOUNTER — Ambulatory Visit (INDEPENDENT_AMBULATORY_CARE_PROVIDER_SITE_OTHER): Payer: BLUE CROSS/BLUE SHIELD | Admitting: Family Medicine

## 2015-12-11 VITALS — BP 130/70 | HR 80 | Temp 97.7°F | Resp 16 | Ht 64.0 in | Wt 130.2 lb

## 2015-12-11 DIAGNOSIS — M797 Fibromyalgia: Secondary | ICD-10-CM

## 2015-12-11 MED ORDER — VENLAFAXINE HCL ER 75 MG PO CP24: 75.0000 mg | ORAL_CAPSULE | Freq: Every day | ORAL | Status: DC

## 2015-12-11 MED ORDER — VENLAFAXINE HCL ER 37.5 MG PO CP24: 37.5000 mg | ORAL_CAPSULE | Freq: Every day | ORAL | Status: DC

## 2015-12-11 NOTE — Progress Notes (Signed)
Name: Marie Harris   MRN: [id number]    DOB: [birth year]   Date:12/11/2015       Progress Note  Subjective  Chief Complaint  Chief Complaint  Patient presents with  . Fibromyalgia    HPI Here for f/u of fibromyalgia.  Also for f/u of fall with wrist and back injuryu.  Wrist and back doing better.  Cannot tell whether some of her pain is still related to fall or to fibromyalgia.  Not on any meds at this time.     She stopped Cymbalta b/o headaches.  She had taken Tramadol on extended basis in past. No problem-specific assessment & plan notes found for this encounter.   Past Medical History  Diagnosis Date  . Allergy   . Arthritis   . Headache   . Migraine   . Fibromyalgia     Past Surgical History  Procedure Laterality Date  . Breast surgery      cyst removal  . Knee surgery      orthoscopic  . Cystectomy      belly and pelvic laproscopic    Family History  Problem Relation Age of Onset  . Hernia Mother   . Stroke Father   . Hyperlipidemia Brother   . Hypertension Brother   . Diabetes Brother   . Brain cancer Son   . Cancer Maternal Aunt     colon cancer  . Diabetes Maternal Uncle   . Heart disease Maternal Grandfather     Social History   Social History  . Marital Status: Married    Spouse Name: N/A  . Number of Children: N/A  . Years of Education: N/A   Occupational History  . Not on file.   Social History Main Topics  . Smoking status: Current Every Day Smoker -- 1.00 packs/day    Types: Cigarettes  . Smokeless tobacco: Not on file  . Alcohol Use: Yes  . Drug Use: No  . Sexual Activity: Not on file   Other Topics Concern  . Not on file   Social History Narrative     Current outpatient prescriptions:  .  meloxicam (MOBIC) 15 MG tablet, Take 1 tablet (15 mg total) by mouth daily. (Patient not taking: Reported on 12/11/2015), Disp: 30 tablet, Rfl: 2 .  venlafaxine XR (EFFEXOR XR) 37.5 MG 24 hr capsule, Take 1 capsule (37.5 mg total) by mouth  daily with breakfast., Disp: 7 capsule, Rfl: 0 .  venlafaxine XR (EFFEXOR XR) 75 MG 24 hr capsule, Take 1 capsule (75 mg total) by mouth daily with breakfast. Start after finishing 37.5 mg caps., Disp: 30 capsule, Rfl: 3  Not on File   Review of Systems  Constitutional: Positive for malaise/fatigue. Negative for fever, chills and weight loss.  HENT: Positive for nosebleeds (random). Negative for hearing loss.   Eyes: Negative for blurred vision and double vision.  Respiratory: Negative for cough, shortness of breath and wheezing.   Cardiovascular: Negative for chest pain, palpitations and leg swelling.  Gastrointestinal: Negative for heartburn, abdominal pain and blood in stool.  Genitourinary: Negative for dysuria, urgency and frequency.  Musculoskeletal: Positive for myalgias and back pain.  Skin: Negative for rash.  Neurological: Positive for weakness. Negative for tremors and headaches.      Objective  Filed Vitals:   12/11/15 0809 12/11/15 0850  BP: 146/89 130/70  Pulse: 80   Temp: 97.7 F (36.5 C)   TempSrc: Oral   Resp: 16   Height:  (1.626  m)   Weight: 130 lb 3.2 oz (59.058 kg)     Physical Exam  Constitutional: She is oriented to person, place, and time and well-developed, well-nourished, and in no distress. No distress.  HENT:  Head: Normocephalic and atraumatic.  Eyes: Conjunctivae and EOM are normal. Pupils are equal, round, and reactive to light. No scleral icterus.  Neck: Normal range of motion. Neck supple. Carotid bruit is not present. No thyromegaly present.  Cardiovascular: Normal rate, regular rhythm and normal heart sounds.  Exam reveals no gallop and no friction rub.   No murmur heard. Pulmonary/Chest: Effort normal and breath sounds normal. No respiratory distress. She has no wheezes. She has no rales.  Musculoskeletal: She exhibits no edema.  Multiple areas of tenderness to palpation over back, elbows, lower arms, legs.  Lymphadenopathy:     She has no cervical adenopathy.  Neurological: She is alert and oriented to person, place, and time. No cranial nerve deficit. Coordination normal.  Vitals reviewed.      Recent Results (from the past 2160 hour(s))  Pregnancy, urine POC     Status: None   Collection Time: 09/14/15  1:52 AM  Result Value Ref Range   Preg Test, Ur NEGATIVE NEGATIVE    Comment:        THE SENSITIVITY OF THIS METHODOLOGY IS >24 mIU/mL      Assessment & Plan  Problem List Items Addressed This Visit      Musculoskeletal and Integument   Fibromyalgia - Primary   Relevant Medications   venlafaxine XR (EFFEXOR XR) 37.5 MG 24 hr capsule   venlafaxine XR (EFFEXOR XR) 75 MG 24 hr capsule      Meds ordered this encounter  Medications  . DISCONTD: oxyCODONE (OXY IR/ROXICODONE) 5 MG immediate release tablet    Sig: Take 5 mg by mouth as needed.  . venlafaxine XR (EFFEXOR XR) 37.5 MG 24 hr capsule    Sig: Take 1 capsule (37.5 mg total) by mouth daily with breakfast.    Dispense:  7 capsule    Refill:  0  . venlafaxine XR (EFFEXOR XR) 75 MG 24 hr capsule    Sig: Take 1 capsule (75 mg total) by mouth daily with breakfast. Start after finishing 37.5 mg caps.    Dispense:  30 capsule    Refill:  3   1. Fibromyalgia  - venlafaxine XR (EFFEXOR XR) 37.5 MG 24 hr capsule; Take 1 capsule (37.5 mg total) by mouth daily with breakfast.  Dispense: 7 capsule; Refill: 0 - venlafaxine XR (EFFEXOR XR) 75 MG 24 hr capsule; Take 1 capsule (75 mg total) by mouth daily with breakfast. Start after finishing 37.5 mg caps.  Dispense: 30 capsule; Refill: 3

## 2016-01-22 ENCOUNTER — Ambulatory Visit: Payer: BLUE CROSS/BLUE SHIELD | Admitting: Family Medicine

## 2016-01-25 ENCOUNTER — Encounter: Payer: Self-pay | Admitting: Family Medicine

## 2016-01-25 ENCOUNTER — Ambulatory Visit (INDEPENDENT_AMBULATORY_CARE_PROVIDER_SITE_OTHER): Payer: BLUE CROSS/BLUE SHIELD | Admitting: Family Medicine

## 2016-01-25 VITALS — BP 142/70 | HR 80 | Temp 98.1°F | Resp 16 | Ht 64.0 in | Wt 129.0 lb

## 2016-01-25 DIAGNOSIS — M797 Fibromyalgia: Secondary | ICD-10-CM | POA: Diagnosis not present

## 2016-01-25 MED ORDER — VENLAFAXINE HCL ER 150 MG PO CP24: 150.0000 mg | ORAL_CAPSULE | Freq: Every day | ORAL | Status: DC

## 2016-01-25 NOTE — Progress Notes (Signed)
Name: Marie Harris   MRN: [id number]    DOB: 09/12/1971   Date:01/25/2016       Progress Note  Subjective  Chief Complaint  Chief Complaint  Patient presents with  . Fibromyalgia    HPI Here to f/u fibromyalgia.  Still with same aches and pains.  Just hurts all over.  No improvement in sleep.  No problem-specific assessment & plan notes found for this encounter.   Past Medical History  Diagnosis Date  . Allergy   . Arthritis   . Headache   . Migraine   . Fibromyalgia     Past Surgical History  Procedure Laterality Date  . Breast surgery      cyst removal  . Knee surgery      orthoscopic  . Cystectomy      belly and pelvic laproscopic    Family History  Problem Relation Age of Onset  . Hernia Mother   . Stroke Father   . Hyperlipidemia Brother   . Hypertension Brother   . Diabetes Brother   . Brain cancer Son   . Cancer Maternal Aunt     colon cancer  . Diabetes Maternal Uncle   . Heart disease Maternal Grandfather     Social History   Social History  . Marital Status: Married    Spouse Name: N/A  . Number of Children: N/A  . Years of Education: N/A   Occupational History  . Not on file.   Social History Main Topics  . Smoking status: Current Every Day Smoker -- 1.00 packs/day    Types: Cigarettes  . Smokeless tobacco: Not on file  . Alcohol Use: Yes  . Drug Use: No  . Sexual Activity: Not on file   Other Topics Concern  . Not on file   Social History Narrative     Current outpatient prescriptions:  .  meloxicam (MOBIC) 15 MG tablet, Take 1 tablet (15 mg total) by mouth daily., Disp: 30 tablet, Rfl: 2 .  venlafaxine XR (EFFEXOR XR) 150 MG 24 hr capsule, Take 1 capsule (150 mg total) by mouth daily with breakfast., Disp: 30 capsule, Rfl: 4  Not on File   Review of Systems  Constitutional: Positive for malaise/fatigue. Negative for fever, chills and weight loss.  HENT: Negative for hearing loss.   Eyes: Negative for blurred vision and  double vision.  Respiratory: Negative for cough, shortness of breath and wheezing.   Cardiovascular: Negative for chest pain, palpitations and leg swelling.  Gastrointestinal: Negative for heartburn, abdominal pain and blood in stool.  Genitourinary: Negative for dysuria, urgency and frequency.  Musculoskeletal: Positive for myalgias.  Skin: Negative for rash.  Neurological: Positive for weakness. Negative for headaches.      Objective  Filed Vitals:   01/25/16 0806  BP: 142/70  Pulse: 80  Temp: 98.1 F (36.7 C)  TempSrc: Oral  Resp: 16  Height: 5\' 4"  (1.626 m)  Weight: 129 lb (58.514 kg)    Physical Exam  Constitutional: She is oriented to person, place, and time and well-developed, well-nourished, and in no distress. No distress.  HENT:  Head: Normocephalic and atraumatic.  Eyes: Conjunctivae and EOM are normal. Pupils are equal, round, and reactive to light. No scleral icterus.  Neck: Normal range of motion. Neck supple. Carotid bruit is not present. No thyromegaly present.  Cardiovascular: Normal rate, regular rhythm and normal heart sounds.  Exam reveals no gallop and no friction rub.   No murmur heard. Pulmonary/Chest: Effort  normal and breath sounds normal. No respiratory distress. She has no wheezes. She has no rales.  Abdominal: Soft. Bowel sounds are normal. She exhibits no distension and no mass. There is no tenderness.  Musculoskeletal: She exhibits no edema.  Lymphadenopathy:    She has no cervical adenopathy.  Neurological: She is alert and oriented to person, place, and time.  Psychiatric:  Affect appears mildly depressed.  Vitals reviewed.      No results found for this or any previous visit (from the past 2160 hour(s)).   Assessment & Plan  Problem List Items Addressed This Visit      Musculoskeletal and Integument   Fibromyalgia - Primary   Relevant Medications   venlafaxine XR (EFFEXOR XR) 150 MG 24 hr capsule      Meds ordered this  encounter  Medications  . venlafaxine XR (EFFEXOR XR) 150 MG 24 hr capsule    Sig: Take 1 capsule (150 mg total) by mouth daily with breakfast.    Dispense:  30 capsule    Refill:  4   1. Fibromyalgia  - venlafaxine XR (EFFEXOR XR) 150 MG 24 hr capsule; Take 1 capsule (150 mg total) by mouth daily with breakfast.  Dispense: 30 capsule; Refill: 4 RTC- 1 month

## 2016-03-10 ENCOUNTER — Ambulatory Visit (INDEPENDENT_AMBULATORY_CARE_PROVIDER_SITE_OTHER): Payer: BLUE CROSS/BLUE SHIELD | Admitting: Family Medicine

## 2016-03-10 ENCOUNTER — Encounter: Payer: Self-pay | Admitting: Family Medicine

## 2016-03-10 VITALS — BP 134/80 | HR 82 | Temp 98.7°F | Resp 16 | Ht 64.0 in | Wt 127.0 lb

## 2016-03-10 DIAGNOSIS — T148XXA Other injury of unspecified body region, initial encounter: Secondary | ICD-10-CM

## 2016-03-10 DIAGNOSIS — M797 Fibromyalgia: Secondary | ICD-10-CM

## 2016-03-10 DIAGNOSIS — T148 Other injury of unspecified body region: Secondary | ICD-10-CM | POA: Diagnosis not present

## 2016-03-10 MED ORDER — VENLAFAXINE HCL ER 75 MG PO CP24: ORAL_CAPSULE | ORAL | Status: DC

## 2016-03-10 NOTE — Progress Notes (Signed)
Name: Marie Harris   MRN: [id number]    DOB: 01/09/1971   Date:03/10/2016       Progress Note  Subjective  Chief Complaint  Chief Complaint  Patient presents with  . Fibromyalgia    HPI Here for f/u of fibromyalgia.  She has had some minor improvement in her pain with higher dose of Effexor.  She c/o bruising with flair ups of fibromyalgia.  No problem-specific assessment & plan notes found for this encounter.   Past Medical History  Diagnosis Date  . Allergy   . Arthritis   . Headache   . Migraine   . Fibromyalgia     Past Surgical History  Procedure Laterality Date  . Breast surgery      cyst removal  . Knee surgery      orthoscopic  . Cystectomy      belly and pelvic laproscopic    Family History  Problem Relation Age of Onset  . Hernia Mother   . Stroke Father   . Hyperlipidemia Brother   . Hypertension Brother   . Diabetes Brother   . Brain cancer Son   . Cancer Maternal Aunt     colon cancer  . Diabetes Maternal Uncle   . Heart disease Maternal Grandfather     Social History   Social History  . Marital Status: Married    Spouse Name: N/A  . Number of Children: N/A  . Years of Education: N/A   Occupational History  . Not on file.   Social History Main Topics  . Smoking status: Current Every Day Smoker -- 1.00 packs/day    Types: Cigarettes  . Smokeless tobacco: Never Used  . Alcohol Use: 0.0 oz/week    0 Standard drinks or equivalent per week  . Drug Use: No  . Sexual Activity: Not on file   Other Topics Concern  . Not on file   Social History Narrative     Current outpatient prescriptions:  .  venlafaxine XR (EFFEXOR-XR) 75 MG 24 hr capsule, Take 3 capsules each morning, Disp: 90 capsule, Rfl: 6  Not on File   Review of Systems  Constitutional: Positive for malaise/fatigue. Negative for fever, chills and weight loss.  HENT: Negative for hearing loss.   Eyes: Negative for blurred vision and double vision.  Respiratory: Negative  for cough, shortness of breath and wheezing.   Cardiovascular: Negative for chest pain, palpitations and leg swelling.  Gastrointestinal: Negative for abdominal pain and blood in stool.  Genitourinary: Negative for dysuria, urgency and frequency.  Musculoskeletal: Positive for myalgias (all over).  Skin: Positive for rash.  Neurological: Negative for tremors, weakness and headaches.      Objective  Filed Vitals:   03/10/16 1539  BP: 134/80  Pulse: 82  Temp: 98.7 F (37.1 C)  TempSrc: Oral  Resp: 16  Height: 5\' 4"  (1.626 m)  Weight: 127 lb (57.607 kg)    Physical Exam  Constitutional: She is oriented to person, place, and time and well-developed, well-nourished, and in no distress. No distress.  HENT:  Head: Normocephalic and atraumatic.  Eyes: Conjunctivae and EOM are normal. Pupils are equal, round, and reactive to light. No scleral icterus.  Neck: Normal range of motion. No thyromegaly present.  Cardiovascular: Normal rate, regular rhythm and normal heart sounds.  Exam reveals no gallop and no friction rub.   No murmur heard. Pulmonary/Chest: Effort normal and breath sounds normal. No respiratory distress. She has no wheezes. She has no rales.  Abdominal: Soft. Bowel sounds are normal. She exhibits no distension and no mass. There is no tenderness.  Musculoskeletal: She exhibits no edema.  Diffuse trigger point tenderness  Lymphadenopathy:    She has no cervical adenopathy.  Neurological: She is alert and oriented to person, place, and time.  Skin:  Only 2 small bruises on L thigh at present.  Vitals reviewed.      No results found for this or any previous visit (from the past 2160 hour(s)).   Assessment & Plan  Problem List Items Addressed This Visit      Musculoskeletal and Integument   Fibromyalgia - Primary   Relevant Medications   venlafaxine XR (EFFEXOR-XR) 75 MG 24 hr capsule     Other   Bruising   Relevant Orders   Comprehensive Metabolic Panel  (CMET)   CBC with Differential      Meds ordered this encounter  Medications  . venlafaxine XR (EFFEXOR-XR) 75 MG 24 hr capsule    Sig: Take 3 capsules each morning    Dispense:  90 capsule    Refill:  6   1. Fibromyalgia  - venlafaxine XR (EFFEXOR-XR) 75 MG 24 hr capsule; Take 3 capsules each morning  Dispense: 90 capsule; Refill: 6  2. Bruising  - Comprehensive Metabolic Panel (CMET) - CBC with Differential

## 2016-03-14 LAB — COMPREHENSIVE METABOLIC PANEL
ALT: 17 IU/L (ref 0–32)
AST: 20 IU/L (ref 0–40)
Albumin/Globulin Ratio: 2 (ref 1.2–2.2)
Albumin: 4.5 g/dL (ref 3.5–5.5)
Alkaline Phosphatase: 46 IU/L (ref 39–117)
BUN/Creatinine Ratio: 11 (ref 9–23)
BUN: 9 mg/dL (ref 6–24)
Bilirubin Total: 0.3 mg/dL (ref 0.0–1.2)
CO2: 24 mmol/L (ref 18–29)
Calcium: 8.9 mg/dL (ref 8.7–10.2)
Chloride: 105 mmol/L (ref 96–106)
Creatinine, Ser: 0.8 mg/dL (ref 0.57–1.00)
GFR calc Af Amer: 103 mL/min/{1.73_m2} (ref >59)
GFR calc non Af Amer: 89 mL/min/{1.73_m2} (ref >59)
Globulin, Total: 2.2 g/dL (ref 1.5–4.5)
Glucose: 100 mg/dL — ABNORMAL HIGH (ref 65–99)
Potassium: 4.7 mmol/L (ref 3.5–5.2)
Sodium: 144 mmol/L (ref 134–144)
Total Protein: 6.7 g/dL (ref 6.0–8.5)

## 2016-03-14 LAB — CBC WITH DIFFERENTIAL/PLATELET
Basophils Absolute: 0 10*3/uL (ref 0.0–0.2)
Basos: 0 %
EOS (ABSOLUTE): 0 10*3/uL (ref 0.0–0.4)
Eos: 0 %
Hematocrit: 42.3 % (ref 34.0–46.6)
Hemoglobin: 14.6 g/dL (ref 11.1–15.9)
Immature Grans (Abs): 0 10*3/uL (ref 0.0–0.1)
Immature Granulocytes: 0 %
Lymphocytes Absolute: 2.2 10*3/uL (ref 0.7–3.1)
Lymphs: 45 %
MCH: 32.5 pg (ref 26.6–33.0)
MCHC: 34.5 g/dL (ref 31.5–35.7)
MCV: 94 fL (ref 79–97)
Monocytes Absolute: 0.6 10*3/uL (ref 0.1–0.9)
Monocytes: 12 %
Neutrophils Absolute: 2.2 10*3/uL (ref 1.4–7.0)
Neutrophils: 43 %
Platelets: 186 10*3/uL (ref 150–379)
RBC: 4.49 x10E6/uL (ref 3.77–5.28)
RDW: 12.5 % (ref 12.3–15.4)
WBC: 5.1 10*3/uL (ref 3.4–10.8)

## 2016-05-19 ENCOUNTER — Ambulatory Visit: Payer: BLUE CROSS/BLUE SHIELD | Admitting: Family Medicine

## 2016-05-27 ENCOUNTER — Ambulatory Visit: Payer: BLUE CROSS/BLUE SHIELD | Admitting: Family Medicine

## 2016-05-28 ENCOUNTER — Encounter: Payer: Self-pay | Admitting: Family Medicine

## 2016-05-28 ENCOUNTER — Ambulatory Visit (INDEPENDENT_AMBULATORY_CARE_PROVIDER_SITE_OTHER): Payer: BLUE CROSS/BLUE SHIELD | Admitting: Family Medicine

## 2016-05-28 VITALS — BP 104/75 | HR 92 | Temp 98.8°F | Resp 16 | Ht 64.0 in | Wt 125.0 lb

## 2016-05-28 DIAGNOSIS — J40 Bronchitis, not specified as acute or chronic: Secondary | ICD-10-CM

## 2016-05-28 DIAGNOSIS — M797 Fibromyalgia: Secondary | ICD-10-CM | POA: Diagnosis not present

## 2016-05-28 MED ORDER — AZITHROMYCIN 250 MG PO TABS: ORAL_TABLET | ORAL | Status: AC

## 2016-05-28 MED ORDER — DM-GUAIFENESIN ER 30-600 MG PO TB12: 1.0000 | ORAL_TABLET | Freq: Two times a day (BID) | ORAL | Status: AC | PRN

## 2016-05-28 MED ORDER — ALBUTEROL SULFATE HFA 108 (90 BASE) MCG/ACT IN AERS: 2.0000 | INHALATION_SPRAY | Freq: Four times a day (QID) | RESPIRATORY_TRACT | Status: AC | PRN

## 2016-05-28 NOTE — Progress Notes (Signed)
Name: Marie Harris   MRN: [id number]    DOB: 05/24/1971   Date:05/28/2016       Progress Note  Subjective  Chief Complaint  Chief Complaint  Patient presents with  . Fibromyalgia    HPI  Here for f/u of fibromyalgia.  She is on 225 mg of Effexor now.  She has more good days now and pain is at least tolerable.  Sleep is +/- .  Sh reports that she has developed a cough with SOB.  Cough productive of milky sputum.  She has developed laryngitis.  This has meds her feel worse than she was before. No problem-specific assessment & plan notes found for this encounter.   Past Medical History  Diagnosis Date  . Allergy   . Arthritis   . Headache   . Migraine   . Fibromyalgia     Past Surgical History  Procedure Laterality Date  . Breast surgery      cyst removal  . Knee surgery      orthoscopic  . Cystectomy      belly and pelvic laproscopic    Family History  Problem Relation Age of Onset  . Hernia Mother   . Stroke Father   . Hyperlipidemia Brother   . Hypertension Brother   . Diabetes Brother   . Brain cancer Son   . Cancer Maternal Aunt     colon cancer  . Diabetes Maternal Uncle   . Heart disease Maternal Grandfather     Social History   Social History  . Marital Status: Married    Spouse Name: N/A  . Number of Children: N/A  . Years of Education: N/A   Occupational History  . Not on file.   Social History Main Topics  . Smoking status: Current Every Day Smoker -- 1.00 packs/day    Types: Cigarettes  . Smokeless tobacco: Never Used  . Alcohol Use: 0.0 oz/week    0 Standard drinks or equivalent per week  . Drug Use: No  . Sexual Activity: Not on file   Other Topics Concern  . Not on file   Social History Narrative     Current outpatient prescriptions:  .  Venlafaxine HCl 225 MG TB24, Take 1 tablet by mouth daily., Disp: , Rfl:  .  albuterol (PROVENTIL HFA;VENTOLIN HFA) 108 (90 Base) MCG/ACT inhaler, Inhale 2 puffs into the lungs every 6 (six)  hours as needed for wheezing or shortness of breath., Disp: 1 Inhaler, Rfl: 6 .  azithromycin (ZITHROMAX) 250 MG tablet, Take 2 tabs on day 1, then 1 tab daily on days 2-5., Disp: 6 tablet, Rfl: 0 .  dextromethorphan-guaiFENesin (MUCINEX DM) 30-600 MG 12hr tablet, Take 1 tablet by mouth 2 (two) times daily as needed for cough., Disp: 20 tablet, Rfl: 0  Not on File   Review of Systems  Constitutional: Positive for fever (?) and malaise/fatigue. Negative for chills and weight loss.  HENT: Negative for hearing loss.   Eyes: Negative for blurred vision and double vision.  Respiratory: Positive for cough and shortness of breath. Negative for wheezing.   Cardiovascular: Negative for chest pain, palpitations and leg swelling.  Gastrointestinal: Negative for heartburn, abdominal pain and blood in stool.  Genitourinary: Negative for dysuria, urgency and frequency.  Musculoskeletal: Positive for myalgias (mildly improved.).  Skin: Negative for rash.  Neurological: Positive for weakness. Negative for dizziness, tremors and headaches.      Objective  Filed Vitals:   05/28/16 1557  BP: 104/75  Pulse: 92  Temp: 98.8 F (37.1 C)  TempSrc: Oral  Resp: 16  Height: 5\' 4"  (1.626 m)  Weight: 125 lb (56.7 kg)    Physical Exam  Constitutional: She is oriented to person, place, and time and well-developed, well-nourished, and in no distress. No distress.  HENT:  Head: Normocephalic and atraumatic.  Right Ear: External ear normal.  Left Ear: External ear normal.  Nose: Nose normal.  Mouth/Throat: Oropharynx is clear and moist.  Neck: Normal range of motion. Neck supple. Carotid bruit is not present. No thyromegaly present.  Cardiovascular: Normal rate, regular rhythm and normal heart sounds.  Exam reveals no gallop and no friction rub.   No murmur heard. Pulmonary/Chest: Effort normal. No respiratory distress. She has no wheezes. She has no rales.  Coarse breath sounds throughout.  No wheezes  or rales.  Musculoskeletal:  Less trigger point tenderness than previously.  Lymphadenopathy:    She has no cervical adenopathy.       Right cervical: No superficial cervical, no deep cervical and no posterior cervical adenopathy present.      Left cervical: No superficial cervical, no deep cervical and no posterior cervical adenopathy present.  Neurological: She is alert and oriented to person, place, and time.  Psychiatric:  Affectg is better than previously.  Less flat affect.    Vitals reviewed.      Recent Results (from the past 2160 hour(s))  Comprehensive Metabolic Panel (CMET)     Status: Abnormal   Collection Time: 03/13/16  9:03 AM  Result Value Ref Range   Glucose 100 (H) 65 - 99 mg/dL   BUN 9 6 - 24 mg/dL   Creatinine, Ser 1.61 0.57 - 1.00 mg/dL   GFR calc non Af Amer 89 >59 mL/min/1.73   GFR calc Af Amer 103 >59 mL/min/1.73   BUN/Creatinine Ratio 11 9 - 23   Sodium 144 134 - 144 mmol/L   Potassium 4.7 3.5 - 5.2 mmol/L   Chloride 105 96 - 106 mmol/L   CO2 24 18 - 29 mmol/L   Calcium 8.9 8.7 - 10.2 mg/dL   Total Protein 6.7 6.0 - 8.5 g/dL   Albumin 4.5 3.5 - 5.5 g/dL   Globulin, Total 2.2 1.5 - 4.5 g/dL   Albumin/Globulin Ratio 2.0 1.2 - 2.2   Bilirubin Total 0.3 0.0 - 1.2 mg/dL   Alkaline Phosphatase 46 39 - 117 IU/L   AST 20 0 - 40 IU/L   ALT 17 0 - 32 IU/L  CBC with Differential     Status: None   Collection Time: 03/13/16  9:03 AM  Result Value Ref Range   WBC 5.1 3.4 - 10.8 x10E3/uL   RBC 4.49 3.77 - 5.28 x10E6/uL   Hemoglobin 14.6 11.1 - 15.9 g/dL   Hematocrit 09.6 04.5 - 46.6 %   MCV 94 79 - 97 fL   MCH 32.5 26.6 - 33.0 pg   MCHC 34.5 31.5 - 35.7 g/dL   RDW 40.9 81.1 - 91.4 %   Platelets 186 150 - 379 x10E3/uL   Neutrophils 43 %   Lymphs 45 %   Monocytes 12 %   Eos 0 %   Basos 0 %   Neutrophils Absolute 2.2 1.4 - 7.0 x10E3/uL   Lymphocytes Absolute 2.2 0.7 - 3.1 x10E3/uL   Monocytes Absolute 0.6 0.1 - 0.9 x10E3/uL   EOS (ABSOLUTE) 0.0 0.0 -  0.4 x10E3/uL   Basophils Absolute 0.0 0.0 - 0.2 x10E3/uL   Immature Granulocytes 0 %  Immature Grans (Abs) 0.0 0.0 - 0.1 x10E3/uL     Assessment & Plan  Problem List Items Addressed This Visit      Musculoskeletal and Integument   Fibromyalgia - Primary    Other Visit Diagnoses    Bronchitis        Relevant Medications    azithromycin (ZITHROMAX) 250 MG tablet    albuterol (PROVENTIL HFA;VENTOLIN HFA) 108 (90 Base) MCG/ACT inhaler    dextromethorphan-guaiFENesin (MUCINEX DM) 30-600 MG 12hr tablet       Meds ordered this encounter  Medications  . Venlafaxine HCl 225 MG TB24    Sig: Take 1 tablet by mouth daily.  Marland Kitchen azithromycin (ZITHROMAX) 250 MG tablet    Sig: Take 2 tabs on day 1, then 1 tab daily on days 2-5.    Dispense:  6 tablet    Refill:  0  . albuterol (PROVENTIL HFA;VENTOLIN HFA) 108 (90 Base) MCG/ACT inhaler    Sig: Inhale 2 puffs into the lungs every 6 (six) hours as needed for wheezing or shortness of breath.    Dispense:  1 Inhaler    Refill:  6  . dextromethorphan-guaiFENesin (MUCINEX DM) 30-600 MG 12hr tablet    Sig: Take 1 tablet by mouth 2 (two) times daily as needed for cough.    Dispense:  20 tablet    Refill:  0   1. Fibromyalgia Cont Effexor, 225 mg/d  2. Bronchitis  - azithromycin (ZITHROMAX) 250 MG tablet; Take 2 tabs on day 1, then 1 tab daily on days 2-5.  Dispense: 6 tablet; Refill: 0 - albuterol (PROVENTIL HFA;VENTOLIN HFA) 108 (90 Base) MCG/ACT inhaler; Inhale 2 puffs into the lungs every 6 (six) hours as needed for wheezing or shortness of breath.  Dispense: 1 Inhaler; Refill: 6 - dextromethorphan-guaiFENesin (MUCINEX DM) 30-600 MG 12hr tablet; Take 1 tablet by mouth 2 (two) times daily as needed for cough.  Dispense: 20 tablet; Refill: 0

## 2022-10-18 ENCOUNTER — Emergency Department: Admit: 2022-10-18 | Payer: PRIVATE HEALTH INSURANCE

## 2022-10-18 ENCOUNTER — Inpatient Hospital Stay
Admit: 2022-10-18 | Discharge: 2022-10-18 | Disposition: A | Discharge status: Home / Self Care | Payer: PRIVATE HEALTH INSURANCE | Attending: Emergency Medicine

## 2022-10-18 DIAGNOSIS — M545 Low back pain, unspecified: Secondary | ICD-10-CM

## 2022-10-18 MED ORDER — KETOROLAC TROMETHAMINE 30 MG/ML IJ SOLN
30 MG/ML | INTRAMUSCULAR | Status: AC
Start: 2022-10-18 — End: 2022-10-18
  Administered 2022-10-18: 19:00:00 30 mg via INTRAMUSCULAR

## 2022-10-18 MED ORDER — CYCLOBENZAPRINE HCL 10 MG PO TABS
10 MG | ORAL_TABLET | Freq: Three times a day (TID) | ORAL | 0 refills | Status: DC | PRN
Start: 2022-10-18 — End: 2022-10-18

## 2022-10-18 MED ORDER — CYCLOBENZAPRINE HCL 10 MG PO TABS
10 MG | ORAL_TABLET | Freq: Three times a day (TID) | ORAL | 0 refills | Status: AC | PRN
Start: 2022-10-18 — End: 2022-10-28

## 2022-10-18 MED ORDER — DICLOFENAC SODIUM 50 MG PO TBEC
50 MG | ORAL_TABLET | Freq: Two times a day (BID) | ORAL | 3 refills | Status: AC
Start: 2022-10-18 — End: ?

## 2022-10-18 MED ORDER — DICLOFENAC SODIUM 50 MG PO TBEC
50 MG | ORAL_TABLET | Freq: Two times a day (BID) | ORAL | 3 refills | Status: DC
Start: 2022-10-18 — End: 2022-10-18

## 2022-10-18 MED FILL — KETOROLAC TROMETHAMINE 30 MG/ML IJ SOLN: 30 MG/ML | INTRAMUSCULAR | Qty: 1

## 2022-10-18 NOTE — ED Provider Notes (Signed)
Emergency Department Provider Note       PCP: No primary care provider on file.   Age: 51 y.o.   Sex: female     DISPOSITION Decision To Discharge 10/18/2022 02:02:43 PM       ICD-10-CM    1. Acute midline low back pain without sciatica  M54.50           Medical Decision Making     Complexity of Problems Addressed:  1 or more acute illnesses that pose a threat to life or bodily function.     Data Reviewed and Analyzed:   I independently ordered and reviewed each unique test.         I independently ordered and interpreted the ED       I interpreted the X-rays no fracture.    Discussion of management or test interpretation.  Pt with lower back pain. No injury. No acute on xray. Feeling some better here. Will discharge with meds at home and outpatient follow up.       Risk of Complications and/or Morbidity of Patient Management:  Prescription drug management performed.  Patient was discharged risks and benefits of hospitalization were considered.  Shared medical decision making was utilized in creating the patients health plan today.         Is this patient to be included in the SEP-1 core measure due to severe sepsis or septic shock? No Exclusion criteria - the patient is NOT to be included for SEP-1 Core Measure due to: Infection is not suspected      History      Pt with lower back pain for past day. No injury recently. No saddle anesthesia or change in bowel or bladder function.     The history is provided by the patient. No language interpreter was used.   Back Pain  Location:  Lumbar spine  Quality:  Aching  Radiates to:  Does not radiate  Pain severity:  Moderate  Duration:  1 day  Timing:  Constant  Progression:  Unchanged  Chronicity:  New  Context: not falling, not recent injury and not twisting    Relieved by:  Nothing  Worsened by:  Movement  Associated symptoms: no abdominal pain, no abdominal swelling, no bladder incontinence, no bowel incontinence, no chest pain, no dysuria, no fever, no headaches, no  leg pain, no numbness, no perianal numbness, no tingling and no weakness         Review of Systems   Constitutional:  Negative for chills and fever.   Respiratory:  Negative for cough and shortness of breath.    Cardiovascular:  Negative for chest pain and palpitations.   Gastrointestinal:  Negative for abdominal pain, bowel incontinence, constipation, diarrhea, nausea and vomiting.   Genitourinary:  Negative for bladder incontinence, dysuria and hematuria.   Musculoskeletal:  Positive for back pain. Negative for neck pain.   Skin:  Negative for color change and rash.   Neurological:  Negative for tingling, weakness, numbness and headaches.   All other systems reviewed and are negative.      Physical Exam     Vitals signs and nursing note reviewed:  Vitals:    10/18/22 1317 10/18/22 1402   BP: (!) 118/53    Pulse: 78    Resp: 16    Temp: 98.3 F (36.8 C)    SpO2: 98%    Weight:  57.2 kg (126 lb)   Height:  1.6 m (5\' 3" )  Physical Exam  Vitals and nursing note reviewed.   Constitutional:       Appearance: Normal appearance.   HENT:      Head: Normocephalic and atraumatic.   Cardiovascular:      Rate and Rhythm: Normal rate and regular rhythm.   Pulmonary:      Effort: Pulmonary effort is normal.      Breath sounds: Normal breath sounds. No wheezing.   Abdominal:      General: Bowel sounds are normal.      Palpations: Abdomen is soft.      Tenderness: There is no abdominal tenderness.   Musculoskeletal:         General: Tenderness (lower midline back TTP. no step off deformity.) present. No swelling. Normal range of motion.      Cervical back: Normal range of motion. No tenderness.   Skin:     General: Skin is warm and dry.   Neurological:      Mental Status: She is alert.          Procedures     Procedures    Orders Placed This Encounter   Procedures    XR LUMBAR SPINE (2-3 VIEWS)        Medications given during this emergency department visit:  Medications   ketorolac (TORADOL) injection 30 mg (30 mg  IntraMUSCular Given 10/18/22 1401)       New Prescriptions    CYCLOBENZAPRINE (FLEXERIL) 10 MG TABLET    Take 1 tablet by mouth 3 times daily as needed for Muscle spasms    DICLOFENAC (VOLTAREN) 50 MG EC TABLET    Take 1 tablet by mouth 2 times daily        No past medical history on file.     No past surgical history on file.     Social History     Socioeconomic History    Marital status: Married        Previous Medications    No medications on file        Results for orders placed or performed during the hospital encounter of 10/18/22   XR LUMBAR SPINE (2-3 VIEWS)    Narrative    XR LUMBAR SPINE (2-3 VIEWS)      INDICATION: Age: 89 years. Gender: Female. :  Reason for exam:->pain    COMPARISON: None    FINDINGS:    There is no fracture, traumatic subluxation or other acute osseous abnormality  of the lumbar spine. There are mild to moderate multilevel degenerative disc  changes and spondylosis of the lumbar spine. Aortic vascular calcifications are  noted.      Impression    No acute posttraumatic bony abnormality of the lumbar spine. Mild to moderate  multilevel degenerative disc changes and spondylosis.            XR LUMBAR SPINE (2-3 VIEWS)   Final Result      No acute posttraumatic bony abnormality of the lumbar spine. Mild to moderate   multilevel degenerative disc changes and spondylosis.                              Voice dictation software was used during the making of this note.  This software is not perfect and grammatical and other typographical errors may be present.  This note has not been completely proofread for errors.     Gwenette Greet, MD  10/18/22 [phone]

## 2022-10-18 NOTE — Discharge Instructions (Signed)
Ice and heat to low back

## 2022-10-18 NOTE — ED Triage Notes (Addendum)
Pt arrives to the ER with c/o back pain x one day. Pain 6/10 pain. Pt states no recent injuries but did have a fall prior to thanksgiving.

## 2022-10-18 NOTE — ED Provider Notes (Signed)
Emergency Department Provider Note       PCP: No primary care provider on file.   Age: 51 y.o.   Sex: female     DISPOSITION Decision To Discharge 10/18/2022 02:02:43 PM       ICD-10-CM    1. Acute midline low back pain without sciatica  M54.50           Medical Decision Making     Complexity of Problems Addressed:  1 or more acute illnesses that pose a threat to life or bodily function.     Data Reviewed and Analyzed:   I independently ordered and reviewed each unique test.         I independently ordered and interpreted the ED       I interpreted the X-rays no fracture.    Discussion of management or test interpretation.  Pt with lower back pain. No injury. No acute on xray. Feeling some better here. Will discharge with meds at home and outpatient follow up.       Risk of Complications and/or Morbidity of Patient Management:  Prescription drug management performed.  Patient was discharged risks and benefits of hospitalization were considered.  Shared medical decision making was utilized in creating the patients health plan today.         Is this patient to be included in the SEP-1 core measure due to severe sepsis or septic shock? No Exclusion criteria - the patient is NOT to be included for SEP-1 Core Measure due to: Infection is not suspected      History      Pt with lower back pain for past day. No injury recently. No saddle anesthesia or change in bowel or bladder function.     The history is provided by the patient. No language interpreter was used.   Back Pain  Location:  Lumbar spine  Quality:  Aching  Radiates to:  Does not radiate  Pain severity:  Moderate  Duration:  1 day  Timing:  Constant  Progression:  Unchanged  Chronicity:  New  Context: not falling, not recent injury and not twisting    Relieved by:  Nothing  Worsened by:  Movement  Associated symptoms: no abdominal pain, no abdominal swelling, no bladder incontinence, no bowel incontinence, no chest pain, no dysuria, no fever, no headaches, no  leg pain, no numbness, no perianal numbness, no tingling and no weakness         Review of Systems   Constitutional:  Negative for chills and fever.   Respiratory:  Negative for cough and shortness of breath.    Cardiovascular:  Negative for chest pain and palpitations.   Gastrointestinal:  Negative for abdominal pain, bowel incontinence, constipation, diarrhea, nausea and vomiting.   Genitourinary:  Negative for bladder incontinence, dysuria and hematuria.   Musculoskeletal:  Positive for back pain. Negative for neck pain.   Skin:  Negative for color change and rash.   Neurological:  Negative for tingling, weakness, numbness and headaches.   All other systems reviewed and are negative.      Physical Exam     Vitals signs and nursing note reviewed:  Vitals:    10/18/22 1317 10/18/22 1402   BP: (!) 118/53    Pulse: 78    Resp: 16    Temp: 98.3 F (36.8 C)    SpO2: 98%    Weight:  57.2 kg (126 lb)   Height:  1.6 m (5\' 3" )  Physical Exam  Vitals and nursing note reviewed.   Constitutional:       Appearance: Normal appearance.   HENT:      Head: Normocephalic and atraumatic.   Cardiovascular:      Rate and Rhythm: Normal rate and regular rhythm.   Pulmonary:      Effort: Pulmonary effort is normal.      Breath sounds: Normal breath sounds. No wheezing.   Abdominal:      General: Bowel sounds are normal.      Palpations: Abdomen is soft.      Tenderness: There is no abdominal tenderness.   Musculoskeletal:         General: Tenderness (lower midline back TTP. no step off deformity.) present. No swelling. Normal range of motion.      Cervical back: Normal range of motion. No tenderness.   Skin:     General: Skin is warm and dry.   Neurological:      Mental Status: She is alert.          Procedures     Procedures    Orders Placed This Encounter   Procedures    XR LUMBAR SPINE (2-3 VIEWS)        Medications given during this emergency department visit:  Medications   ketorolac (TORADOL) injection 30 mg (30 mg  IntraMUSCular Given 10/18/22 1401)       New Prescriptions    CYCLOBENZAPRINE (FLEXERIL) 10 MG TABLET    Take 1 tablet by mouth 3 times daily as needed for Muscle spasms    DICLOFENAC (VOLTAREN) 50 MG EC TABLET    Take 1 tablet by mouth 2 times daily        No past medical history on file.     No past surgical history on file.     Social History     Socioeconomic History    Marital status: Married        Previous Medications    No medications on file        Results for orders placed or performed during the hospital encounter of 10/18/22   XR LUMBAR SPINE (2-3 VIEWS)    Narrative    XR LUMBAR SPINE (2-3 VIEWS)      INDICATION: Age: 23 years. Gender: Female. :  Reason for exam:->pain    COMPARISON: None    FINDINGS:    There is no fracture, traumatic subluxation or other acute osseous abnormality  of the lumbar spine. There are mild to moderate multilevel degenerative disc  changes and spondylosis of the lumbar spine. Aortic vascular calcifications are  noted.      Impression    No acute posttraumatic bony abnormality of the lumbar spine. Mild to moderate  multilevel degenerative disc changes and spondylosis.            XR LUMBAR SPINE (2-3 VIEWS)   Final Result      No acute posttraumatic bony abnormality of the lumbar spine. Mild to moderate   multilevel degenerative disc changes and spondylosis.                              Voice dictation software was used during the making of this note.  This software is not perfect and grammatical and other typographical errors may be present.  This note has not been completely proofread for errors.     Gwenette Greet, MD  10/18/22 [phone]

## 2022-10-18 NOTE — Discharge Instructions (Signed)
[facility] PEDIATRIC EMR DEPT  EMERGENCY DEPARTMENT ENCOUNTER      Pt Name: Kristie Horne  MRN: [id number]  [place] June 07, 2013  Date of evaluation: 10/18/2022  Provider: Princess Perna, APRN - NP    CHIEF COMPLAINT       Chief Complaint   Patient presents with    Ankle Pain         HISTORY OF PRESENT ILLNESS   (Location/Symptom, Timing/Onset, Context/Setting, Quality, Duration, Modifying Factors, Severity)  Note limiting factors.   51 y/o female with no significant PMHx presents via wheelchair with family to the ER for evaluation of left ankle pain. Patient states that she was at a [facility] prior to arrival when she jumped off a hard landing onto a spring loaded flooring and twisted her left ankle. Patient denies pain to any other areas, mother denies any HX of injury to the left ankle. Patient denies any numbness or tingling to the LLE.         The history is provided by the patient and a grandparent.         Review of External Medical Records:     Nursing Notes were reviewed.    REVIEW OF SYSTEMS    (2-9 systems for level 4, 10 or more for level 5)     Review of Systems   Musculoskeletal:  Positive for arthralgias.        Left ankle pain   Skin:  Positive for color change.        Bruising to the left lateral ankle.    Neurological: Negative.        Except as noted above the remainder of the review of systems was reviewed and negative.       PAST MEDICAL HISTORY   History reviewed. No pertinent past medical history.      SURGICAL HISTORY     History reviewed. No pertinent surgical history.      CURRENT MEDICATIONS       Discharge Medication List as of 10/18/2022  7:51 PM        CONTINUE these medications which have NOT CHANGED    Details   Pediatric Multiple Vitamins (CHILDRENS MULTIVITAMIN) CHEW Take by mouthHistorical Med      UNABLE TO [place]     Patient has no known allergies.    FAMILY HISTORY     History reviewed. No pertinent family history.       SOCIAL HISTORY        Social History     Socioeconomic History    Marital status: Single     Spouse name: None    Number of children: None    Years of education: None    Highest education level: None   Tobacco Use    Smoking status: Never     Passive exposure: Never    Smokeless tobacco: Never           PHYSICAL EXAM    (up to 7 for level 4, 8 or more for level 5)     ED Triage Vitals   BP Temp Temp src Pulse Resp SpO2 Height Weight   10/18/22 1830 10/18/22 1830 10/18/22 1830 10/18/22 1830 10/18/22 1830 10/18/22 1830 -- 10/18/22 1840   (!) 135/81 98.4 F (36.9 C) Oral 102 20 97 %  61.4 kg (135 lb 5.8 oz)       There is no height  or weight on file to calculate BMI.    Physical Exam  Vitals and nursing note reviewed.   Constitutional:       General: She is active. She is not in acute distress.     Appearance: Normal appearance. She is well-developed and normal weight. She is not toxic-appearing.   HENT:      Head: Normocephalic.      Right Ear: Tympanic membrane normal.      Left Ear: Tympanic membrane normal.      Nose: Nose normal.      Mouth/Throat:      Mouth: Mucous membranes are moist.   Eyes:      Pupils: Pupils are equal, round, and reactive to light.   Cardiovascular:      Rate and Rhythm: Normal rate.      Pulses: Normal pulses.   Pulmonary:      Effort: Pulmonary effort is normal.      Breath sounds: Normal breath sounds. No wheezing.   Abdominal:      General: Abdomen is flat.      Palpations: Abdomen is soft.      Tenderness: There is no abdominal tenderness.   Musculoskeletal:      Cervical back: Normal range of motion.      Left ankle: Swelling and ecchymosis present. Tenderness present. Decreased range of motion. Normal pulse.      Left foot: Normal capillary refill. No swelling, tenderness or bony tenderness. Normal pulse.      Comments: Patient with swelling, tenderness to the left lateral ankle, decreased ROM, strong palpable left 2+ DP.    Skin:     General: Skin is warm.      Capillary Refill: Capillary refill  takes less than 2 seconds.   Neurological:      General: No focal deficit present.      Mental Status: She is alert.   Psychiatric:         Mood and Affect: Mood normal.         Behavior: Behavior normal.         Thought Content: Thought content normal.         DIAGNOSTIC RESULTS     EKG: All EKG's are interpreted by the Emergency Department Physician who either signs or Co-signs this chart in the absence of a cardiologist.        RADIOLOGY:   Non-plain film images such as CT, Ultrasound and MRI are read by the radiologist. Plain radiographic images are visualized and preliminarily interpreted by the emergency physician with the below findings:        Interpretation per the Radiologist below, if available at the time of this note:    XR ANKLE LEFT (MIN 3 VIEWS)   Final Result   1. Acute nondisplaced Salter-Harris II fracture of the distal tibia.           LABS:  Labs Reviewed - No data to display    All other labs were within normal range or not returned as of this dictation.    EMERGENCY DEPARTMENT COURSE and DIFFERENTIAL DIAGNOSIS/MDM:   Vitals:    Vitals:    10/18/22 1830 10/18/22 1840   BP: (!) 135/81    Pulse: 102    Resp: 20    Temp: 98.4 F (36.9 C)    TempSrc: Oral    SpO2: 97%    Weight:  61.4 kg (135 lb 5.8 oz)  Medical Decision Making  Differential DX: ankle sprain vs ankle fracture vs ankle dislocation vs torn ligament    1. Previous notes (external to Emergency room) were reviewed: Chart Review    2.History was obtained by: Patient    3. Chronic medical issues affecting this visit: none    4. I ordered the following tests, followed by review of final reads by lab and radiology: xray left ankle    5. I considered ordering: none    6. Medical intervention: ibuprofen and ice pack      Surgical intervention: None Indicated    7. Social determinants of health: None    8 Final diagnosis: Salter-Harris type II physeal fracture of distal end of left tibia, initial encounter. Medications prescribed:  Tylenol, ibuprofen, hydrocodone    9. Disposition: Discharge to home and follow up with Pediatric ORthopedic, return to the emergency room with worsening symptoms. Patient in agreement with plan of care.      Amount and/or Complexity of Data Reviewed  Radiology: ordered. Decision-making details documented in ED Course.    Risk  OTC drugs.  Prescription drug management.  Risk Details: Case discussed with Dr Belinda Fisher.             REASSESSMENT     ED Course as of 10/18/22 2024   Sat Oct 18, 2022   1927 XR ANKLE LEFT (MIN 3 VIEWS)  1. Acute nondisplaced Salter-Harris II fracture of the distal tibia. [AF]      ED Course User Index  [AF] Fleming-Hoyt, Gill Delrossi M, APRN - NP     Patient appears uncomfortable, has been unable to tolerate weight on the left foot due to pain, mother at the bedside denies previous injury to the left ankle.  [place] and [place] solid platform onto a floor that has give to it or sprained to it twisting her ankle inward.  Patient is neurovascularly intact in left lower extremity, capillary refill less than 2 seconds, able to point and flex with mild discomfort.  Will provide dose of ibuprofen, ice pack obtain x-ray and reevaluate.  Patient reevaluated, mother and grandmother at the bedside, discussed x-ray results showing acute nondisplaced Salter-Harris II fracture of the distal tibia.  Will place patient in a short posterior splint with a stirrup, patient provided with  crutches, advised to be nonweightbearing and to follow-up with orthopedics first thing Monday morning.  Discussed with parents to alternate Tylenol with ibuprofen, will send home with prescription for hydrocodone for breakthrough pain, encouraged to rest ice and elevate.Parents verbalize understanding and agree with plan.   Patient provided with school note.   Splint applied by Rayfield Citizen, RN pediatric RN and evaluated by Winfield Rast, APRN - NP.  Neurovascular status intact post splint application.   Desired position maintained.      CONSULTS:  None    PROCEDURES:  Unless otherwise noted below, none     Procedures      FINAL IMPRESSION      1. Salter-Harris type II physeal fracture of distal end of left tibia, initial encounter          DISPOSITION/PLAN   DISPOSITION Decision To Discharge 10/18/2022 07:48:20 PM      PATIENT REFERRED TO:  Aarons, [place] E, MD  [address]  [address]  [place] [address]  [phone]    Schedule an appointment as soon as possible for a visit   Call and schedule a follow appt.-Pediatric Orthopedic  Jodean LimaHale, Gregory, MD  [address]  [address]  [facility]  [place] [id number]  [phone]    Schedule an appointment as soon as possible for a visit   Call and schedule a follow up appt.-Pediatric Orthopedic    [facility] PEDIATRIC EMR DEPT  [address]  [place] [id number]  [phone]          DISCHARGE MEDICATIONS:  Discharge Medication List as of 10/18/2022  7:51 PM        START taking these medications    Details   ibuprofen (ADVIL) 200 MG tablet Take 2 tablets by mouth every 6 hours as needed for Pain, Disp-120 tablet, R-1Normal      Acetaminophen Childrens (TYLENOL CHILDRENS CHEWABLES) 160 MG CHEW Take 4 tablets by mouth every 3-4 hours as needed (pain), Disp-250 tablet, R-1Normal      HYDROcodone-acetaminophen (LORTAB) 10-300 MG per 15ML SOLN solution Take 9.3 mLs by mouth every 6 hours as needed for Pain for up to 5 days. Max Daily Amount: 37.2 mLs, Disp-167.4 mL, R-0Normal               Child has been re-examined and appears well.  Child is active, interactive and appears well hydrated.   Laboratory tests, medications, x-rays, diagnosis, follow up plan and return instructions have been reviewed and discussed with the family.  Family has had the opportunity to ask questions about their child's care.  Family expresses understanding and agreement with care plan, follow up and return instructions.  Family agrees to return the child to the ER in 48 hours if their symptoms  are not improving or immediately if they have any change in their condition.  Family understands to follow up with their pediatrician as instructed to ensure resolution of the issue seen for today.    (Please note that portions of this note were completed with a voice recognition program.  Efforts were made to edit the dictations but occasionally words are mis-transcribed.)    Tyeler Goedken Nobie PutnamM Fleming-Hoyt, APRN - NP (electronically signed)  Emergency Attending Physician / Physician Assistant / Nurse Practitioner    8:24 PM            Winfield RastFleming-Hoyt, Kortni Hasten M, APRN - NP  10/18/22 2025

## 2022-10-18 NOTE — ED Notes (Signed)
I have reviewed discharge instructions with the patient.  The patient verbalized understanding.    Patient left ED via Discharge Method: ambulatory to Home with self.    Opportunity for questions and clarification provided.       Patient given 2 scripts.         To continue your aftercare when you leave the hospital, you may receive an automated call from our care team to check in on how you are doing.  This is a free service and part of our promise to provide the best care and service to meet your aftercare needs." If you have questions, or wish to unsubscribe from this service please call [phone].  Thank you for Choosing our [place] [facility].        Khayree Delellis, LPN  10/18/22 1411
